# Patient Record
Sex: Female | Born: 1970 | Race: Black or African American | Hispanic: No | Marital: Single | Smoking: Never smoker
Health system: Southern US, Community
[De-identification: ages and names within clinical notes are randomized; demographics above are authoritative.]

## PROBLEM LIST (undated history)

## (undated) DIAGNOSIS — E079 Disorder of thyroid, unspecified: Secondary | ICD-10-CM

---

## 2006-06-23 ENCOUNTER — Emergency Department (HOSPITAL_COMMUNITY): Admission: EM | Admit: 2006-06-23 | Discharge: 2006-06-23 | Payer: Self-pay | Admitting: Emergency Medicine

## 2007-05-25 ENCOUNTER — Encounter (HOSPITAL_COMMUNITY): Admission: RE | Admit: 2007-05-25 | Discharge: 2007-05-26 | Payer: Self-pay | Admitting: Internal Medicine

## 2010-08-27 ENCOUNTER — Encounter: Payer: Self-pay | Admitting: Family Medicine

## 2012-07-21 ENCOUNTER — Encounter: Payer: Self-pay | Admitting: Physician Assistant

## 2012-07-21 ENCOUNTER — Ambulatory Visit (INDEPENDENT_AMBULATORY_CARE_PROVIDER_SITE_OTHER): Payer: Medicaid Other | Admitting: Physician Assistant

## 2012-07-21 VITALS — BP 110/70 | HR 86 | Temp 98.7°F | Resp 18 | Ht 68.0 in | Wt 219.0 lb

## 2012-07-21 DIAGNOSIS — N912 Amenorrhea, unspecified: Secondary | ICD-10-CM

## 2012-07-21 NOTE — Progress Notes (Signed)
   Patient ID: Cheyenne Campbell MRN: [id number], DOB: 10/30/1970, 42 y.o. Date of Encounter: 07/21/2012, 12:13 PM    Chief Complaint:  Chief Complaint  Patient presents with  . other    possible preg     HPI: 42 y.o. year old female reports that her menses have always been very regular. However, never had period in April. LMP was 06/02/12.  Did home pregnancy test-negative. But wanted to check here. Has no other symptoms or complaints.  Mentions that she has been seeing Dr Jeanell Sparrow for hyperthyroidism. Brought copy of recent lab- numbers currently close to nml.      Home Meds: See attached medication section for any medications that were entered at today's visit. The computer does not put those onto this list.The following list is a list of meds entered prior to today's visit.   No current outpatient prescriptions on file prior to visit.   No current facility-administered medications on file prior to visit.    Allergies: No Known Allergies    Review of Systems: See HPI for pertinent ROS. All other ROS negative.    Physical Exam: Blood pressure 110/70, pulse 86, temperature 98.7 F (37.1 C), temperature source Oral, resp. rate 18, height 5\' 8"  (1.727 m), weight 219 lb (99.338 kg), last menstrual period 06/09/2012., Body mass index is 33.31 kg/(m^2). General: AAF.  Appears in no acute distress.  Lungs: Clear bilaterally to auscultation without wheezes, rales, or rhonchi. Breathing is unlabored. Heart: Regular rhythm. No murmurs, rubs, or gallops. Msk:  Strength and tone normal for age. Extremities/Skin: Warm and dry. No clubbing or cyanosis. No edema. No rashes or suspicious lesions. Neuro: Alert and oriented X 3. Moves all extremities spontaneously. Gait is normal. CNII-XII grossly in tact. Psych:  Responds to questions appropriately with a normal affect.   Results for orders placed in visit on 07/21/12  PREGNANCY, URINE      Result Value Range   Preg Test, Ur NEG        ASSESSMENT AND PLAN:  42 y.o. year old female with  1. Amenorrhea - Pregnancy, urine negative  Discussed that she is probably starting to undergo perimenopause. Discussed that often women have 1-2 years of irregular menses then menopause so she can anticipate possible irregular periods. F/U if develops any other symptoms, etc.    Signed, [address] [name], [place], [facility] 07/21/2012 12:13 PM

## 2012-07-22 ENCOUNTER — Encounter (HOSPITAL_COMMUNITY): Payer: Self-pay | Admitting: Emergency Medicine

## 2012-07-22 ENCOUNTER — Emergency Department (HOSPITAL_COMMUNITY)
Admission: EM | Admit: 2012-07-22 | Discharge: 2012-07-22 | Disposition: A | Discharge status: Home / Self Care | Payer: Medicaid Other | Attending: Emergency Medicine | Admitting: Emergency Medicine

## 2012-07-22 DIAGNOSIS — Z79899 Other long term (current) drug therapy: Secondary | ICD-10-CM | POA: Insufficient documentation

## 2012-07-22 DIAGNOSIS — Z862 Personal history of diseases of the blood and blood-forming organs and certain disorders involving the immune mechanism: Secondary | ICD-10-CM | POA: Insufficient documentation

## 2012-07-22 DIAGNOSIS — R21 Rash and other nonspecific skin eruption: Secondary | ICD-10-CM

## 2012-07-22 DIAGNOSIS — Z8639 Personal history of other endocrine, nutritional and metabolic disease: Secondary | ICD-10-CM | POA: Insufficient documentation

## 2012-07-22 HISTORY — DX: Disorder of thyroid, unspecified: E07.9

## 2012-07-22 MED ORDER — HYDROCORTISONE 2.5 % EX LOTN: TOPICAL_LOTION | Freq: Two times a day (BID) | CUTANEOUS | Status: DC

## 2012-07-22 MED ORDER — SULFAMETHOXAZOLE-TRIMETHOPRIM 800-160 MG PO TABS: 1.0000 | ORAL_TABLET | Freq: Two times a day (BID) | ORAL | Status: DC

## 2012-07-22 NOTE — ED Notes (Signed)
Patient reports that last night she started to have swelling to her left arm. The patient has several areas of redness with a bug bite to them. She reports that her arm itches

## 2012-07-22 NOTE — ED Provider Notes (Signed)
History    This chart was scribed for Cheyenne Campbell, a non-physician practitioner working with Cheyenne Racer, MD by Cheyenne Campbell, ED Scribe. This patient was seen in room WTR8/WTR8 and the patient's care was started at 1515.     CSN: [id number]  Arrival date & time 07/22/12  1403   First MD Initiated Contact with Patient 07/22/12 1506      Chief Complaint  Patient presents with  . Rash    (Consider location/radiation/quality/duration/timing/severity/associated sxs/prior treatment) The history is provided by the patient.   HPI Comments: Cheyenne Campbell is a 42 y.o. female who presents to the Emergency Department complaining of constant worsening pruritic rash on left forearm onset last night. Reports intermittent clear drainage, and swelling. Reports possible unknown bug bite last night. Denies fever, chest pain, shortness of breath, and other associated symptoms. Denies any aggravating and alleviating factors. Denies changing laundry detergent, soaps, and lotions. Denies taking any OTC medications to treat symptoms.   Past Medical History  Diagnosis Date  . Thyroid disease     History reviewed. No pertinent past surgical history.  History reviewed. No pertinent family history.  History  Substance Use Topics  . Smoking status: Never Smoker   . Smokeless tobacco: Not on file  . Alcohol Use: No    OB History   Grav Para Term Preterm Abortions TAB SAB Ect Mult Living                  Review of Systems A complete 10 system review of systems was obtained and all systems are negative except as noted in the HPI and PMH.    Allergies  Review of patient's allergies indicates no known allergies.  Home Medications   Current Outpatient Rx  Name  Route  Sig  Dispense  Refill  . folic acid (FOLVITE) 400 MCG tablet   Oral   Take 400 mcg by mouth daily.         . methimazole (TAPAZOLE) 5 MG tablet   Oral   Take 5 mg by mouth 3 (three) times daily.          . Prenatal Vit-Fe Fumarate-FA (PRENATAL MULTIVITAMIN) TABS   Oral   Take 1 tablet by mouth daily at 12 noon.           BP 124/81  Pulse 88  Temp(Src) 98.7 F (37.1 C) (Oral)  Resp 18  Wt 219 lb (99.338 kg)  BMI 33.31 kg/m2  SpO2 100%  LMP 06/09/2012  Physical Exam  Nursing note and vitals reviewed. Constitutional: She is oriented to person, place, and time. She appears well-developed and well-nourished. No distress.  HENT:  Head: Normocephalic and atraumatic.  Mouth/Throat: Oropharynx is clear and moist.  Airway is patent and no swelling of oropharynx, tongue, or lips.   Eyes: EOM are normal.  Neck: Normal range of motion. Neck supple. No tracheal deviation present.  Cardiovascular: Normal rate, regular rhythm, normal heart sounds and intact distal pulses.   Pulmonary/Chest: Effort normal and breath sounds normal. No respiratory distress.  Musculoskeletal: Normal range of motion.  Neurological: She is alert and oriented to person, place, and time.  Skin: Skin is warm and dry.  Erythematous papules on left forearm, no streaking noted. Mild edema surrounding the papules. No warmth noted.   No drainage.  Psychiatric: She has a normal mood and affect. Her behavior is normal.    ED Course  Procedures (including critical care time) Medications - No data to display  Labs Reviewed - No data to display No results found.   No diagnosis found.    MDM  Patient presents with a chief complaint of rash to the left forearm.  Rash appears to be some sort of allergic reaction or localized inflammatory reaction from possible bug bite.  No erythematous streaking.  Patient is afebrile.  Patient appears stable for discharge.  I personally performed the services described in this documentation, which was scribed in my presence. The recorded information has been reviewed and is accurate.    Cheyenne Lux O'Neill, PA-C 07/22/12 [phone]

## 2012-07-22 NOTE — ED Provider Notes (Signed)
Medical screening examination/treatment/procedure(s) were performed by non-physician practitioner and as supervising physician I was immediately available for consultation/collaboration.   Sharrell Krawiec, MD 07/22/12 1606 

## 2012-11-15 ENCOUNTER — Ambulatory Visit: Payer: Medicaid Other

## 2012-12-29 ENCOUNTER — Other Ambulatory Visit: Payer: Self-pay

## 2012-12-29 DIAGNOSIS — Z1231 Encounter for screening mammogram for malignant neoplasm of breast: Secondary | ICD-10-CM

## 2013-01-22 ENCOUNTER — Ambulatory Visit
Admission: RE | Admit: 2013-01-22 | Discharge: 2013-01-22 | Disposition: A | Discharge status: Home / Self Care | Payer: Medicaid Other | Source: Ambulatory Visit

## 2013-01-22 DIAGNOSIS — Z1231 Encounter for screening mammogram for malignant neoplasm of breast: Secondary | ICD-10-CM

## 2013-08-23 ENCOUNTER — Other Ambulatory Visit: Payer: Medicaid Other | Admitting: Physician Assistant

## 2014-07-31 ENCOUNTER — Ambulatory Visit (INDEPENDENT_AMBULATORY_CARE_PROVIDER_SITE_OTHER): Payer: Medicaid Other | Admitting: Family Medicine

## 2014-07-31 ENCOUNTER — Encounter: Payer: Self-pay | Admitting: Family Medicine

## 2014-07-31 VITALS — BP 130/76 | HR 78 | Temp 98.2°F | Resp 16 | Ht 68.0 in | Wt 231.0 lb

## 2014-07-31 DIAGNOSIS — Z113 Encounter for screening for infections with a predominantly sexual mode of transmission: Secondary | ICD-10-CM | POA: Diagnosis not present

## 2014-07-31 DIAGNOSIS — A5901 Trichomonal vulvovaginitis: Secondary | ICD-10-CM | POA: Diagnosis not present

## 2014-07-31 DIAGNOSIS — R300 Dysuria: Secondary | ICD-10-CM

## 2014-07-31 LAB — WET PREP FOR TRICH, YEAST, CLUE: Yeast Wet Prep HPF POC: NONE SEEN

## 2014-07-31 LAB — URINALYSIS, ROUTINE W REFLEX MICROSCOPIC
BILIRUBIN URINE: NEGATIVE
GLUCOSE, UA: NEGATIVE mg/dL
HGB URINE DIPSTICK: NEGATIVE
Ketones, ur: NEGATIVE mg/dL
Nitrite: NEGATIVE
PROTEIN: NEGATIVE mg/dL
Specific Gravity, Urine: 1.015 (ref 1.005–1.030)
UROBILINOGEN UA: 1 mg/dL (ref 0.0–1.0)
pH: 7.5 (ref 5.0–8.0)

## 2014-07-31 LAB — URINALYSIS, MICROSCOPIC ONLY
CASTS: NONE SEEN
Crystals: NONE SEEN

## 2014-07-31 MED ORDER — METRONIDAZOLE 500 MG PO TABS: 500.0000 mg | ORAL_TABLET | Freq: Two times a day (BID) | ORAL | Status: DC

## 2014-07-31 NOTE — Progress Notes (Signed)
Patient ID: Cheyenne Campbell, female   DOB: 1970/07/15, 44 y.o.   MRN: [id number]   Subjective:    Patient ID: Cheyenne Campbell, female    DOB: 1970/07/15, 44 y.o.   MRN: [id number]  Patient presents for STD Screen  Here for STD screening. She has been exposed to Trichomonas. She states [place] advised her that he was positive for Trichomonas and she needed treatment. She states she was last sexually active in either March or April. She's had vaginal discharge for the past 3 weeks which is yellow and irritating. She denies any significant dysuria that is now resolved. She has not had any abnormal bleeding. She denies any abdominal pain. He also states that she had some irritating bumps in her creases of her groin about a week ago she use Vaseline on them and they resolved.   Review Of Systems: per above   GEN- denies fatigue, fever, weight loss,weakness, recent illness HEENT- denies eye drainage, change in vision, nasal discharge, CVS- denies chest pain, palpitations RESP- denies SOB, cough, wheeze ABD- denies N/V, change in stools, abd pain GU- denies dysuria, hematuria, dribbling, incontinence Neuro- denies headache, dizziness, syncope, seizure activity       Objective:    BP 130/76 mmHg  Pulse 78  Temp(Src) 98.2 F (36.8 C) (Oral)  Resp 16  Ht 5\' 8"  (1.727 m)  Wt 231 lb (104.781 kg)  BMI 35.13 kg/m2  LMP 07/23/2014 (Approximate) GEN- NAD, alert and oriented x3 HEENT- PERRL, EOMI, non injected sclera, pink conjunctiva, MMM, oropharynx clear CVS- RRR, no murmur RESP-CTAB ABD-NABS,soft,NT,ND GU- normal external genitalia, vaginal irritated- moist, cervix visualized no growth, no blood form os, + discharge, no CMT, no ovarian masses, uterus normal size Pulses- Radial, 2+        Assessment & Plan:      Problem List Items Addressed This Visit    None    Visit Diagnoses    Dysuria    -  Primary    Relevant Orders    Urinalysis,  Routine w reflex microscopic (Completed)    Trichomonal vaginitis        positive for trichomonas, other STD screen pending, treat with flagyl, discussed safe sex, avoid sex untils symptoms resolved, no lesions to suggest HSV    Relevant Medications    metroNIDAZOLE (FLAGYL) 500 MG tablet    Other Relevant Orders    WET PREP FOR TRICH, YEAST, CLUE (Completed)    GC/Chlamydia Probe Amp    HSV(herpes simplex vrs) 1+2 ab-IgG    Screen for STD (sexually transmitted disease)        Relevant Orders    HIV antibody    RPR       Note: This dictation was prepared with Dragon dictation along with smaller phrase technology. Any transcriptional errors that result from this process are unintentional.

## 2014-07-31 NOTE — Patient Instructions (Signed)
We will call with lab results  F/U as needed 

## 2014-08-01 LAB — GC/CHLAMYDIA PROBE AMP
CT PROBE, AMP APTIMA: NEGATIVE
GC PROBE AMP APTIMA: NEGATIVE

## 2014-08-01 LAB — HSV(HERPES SIMPLEX VRS) I + II AB-IGG
HSV 1 GLYCOPROTEIN G AB, IGG: 6.55 IV — AB
HSV 2 GLYCOPROTEIN G AB, IGG: 14.59 IV — AB

## 2014-08-01 LAB — RPR

## 2014-08-01 LAB — HIV ANTIBODY (ROUTINE TESTING W REFLEX): HIV: NONREACTIVE

## 2014-08-05 ENCOUNTER — Other Ambulatory Visit: Payer: Self-pay | Admitting: *Deleted

## 2014-08-05 MED ORDER — VALACYCLOVIR HCL 500 MG PO TABS: 500.0000 mg | ORAL_TABLET | Freq: Two times a day (BID) | ORAL | Status: DC

## 2016-07-07 ENCOUNTER — Other Ambulatory Visit: Payer: Self-pay | Admitting: Family Medicine

## 2016-07-07 DIAGNOSIS — Z1231 Encounter for screening mammogram for malignant neoplasm of breast: Secondary | ICD-10-CM

## 2016-08-03 ENCOUNTER — Ambulatory Visit
Admission: RE | Admit: 2016-08-03 | Discharge: 2016-08-03 | Disposition: A | Discharge status: Home / Self Care | Payer: Medicaid Other | Source: Ambulatory Visit | Attending: Family Medicine | Admitting: Family Medicine

## 2016-08-03 DIAGNOSIS — Z1231 Encounter for screening mammogram for malignant neoplasm of breast: Secondary | ICD-10-CM

## 2017-03-12 ENCOUNTER — Emergency Department (HOSPITAL_COMMUNITY): Payer: Medicaid Other

## 2017-03-12 ENCOUNTER — Encounter (HOSPITAL_COMMUNITY): Payer: Self-pay

## 2017-03-12 ENCOUNTER — Other Ambulatory Visit: Payer: Self-pay

## 2017-03-12 ENCOUNTER — Emergency Department (HOSPITAL_COMMUNITY)
Admission: EM | Admit: 2017-03-12 | Discharge: 2017-03-12 | Disposition: A | Discharge status: Home / Self Care | Payer: Medicaid Other | Attending: Emergency Medicine | Admitting: Emergency Medicine

## 2017-03-12 DIAGNOSIS — R220 Localized swelling, mass and lump, head: Secondary | ICD-10-CM | POA: Diagnosis present

## 2017-03-12 DIAGNOSIS — K047 Periapical abscess without sinus: Secondary | ICD-10-CM | POA: Diagnosis not present

## 2017-03-12 DIAGNOSIS — Z79899 Other long term (current) drug therapy: Secondary | ICD-10-CM | POA: Diagnosis not present

## 2017-03-12 LAB — CBC WITH DIFFERENTIAL/PLATELET
BASOS ABS: 0 10*3/uL (ref 0.0–0.1)
Basophils Relative: 0 %
Eosinophils Absolute: 0 10*3/uL (ref 0.0–0.7)
Eosinophils Relative: 0 %
HEMATOCRIT: 37.6 % (ref 36.0–46.0)
Hemoglobin: 12.1 g/dL (ref 12.0–15.0)
LYMPHS ABS: 1.7 10*3/uL (ref 0.7–4.0)
LYMPHS PCT: 22 %
MCH: 26.2 pg (ref 26.0–34.0)
MCHC: 32.2 g/dL (ref 30.0–36.0)
MCV: 81.6 fL (ref 78.0–100.0)
MONO ABS: 0.6 10*3/uL (ref 0.1–1.0)
Monocytes Relative: 8 %
NEUTROS ABS: 5.1 10*3/uL (ref 1.7–7.7)
Neutrophils Relative %: 70 %
Platelets: 332 10*3/uL (ref 150–400)
RBC: 4.61 MIL/uL (ref 3.87–5.11)
RDW: 15.4 % (ref 11.5–15.5)
WBC: 7.4 10*3/uL (ref 4.0–10.5)

## 2017-03-12 LAB — BASIC METABOLIC PANEL
ANION GAP: 7 (ref 5–15)
BUN: 7 mg/dL (ref 6–20)
CO2: 29 mmol/L (ref 22–32)
Calcium: 9.1 mg/dL (ref 8.9–10.3)
Chloride: 105 mmol/L (ref 101–111)
Creatinine, Ser: 0.82 mg/dL (ref 0.44–1.00)
GFR calc Af Amer: >60 mL/min (ref >60)
GFR calc non Af Amer: >60 mL/min (ref >60)
GLUCOSE: 98 mg/dL (ref 65–99)
POTASSIUM: 3.6 mmol/L (ref 3.5–5.1)
Sodium: 141 mmol/L (ref 135–145)

## 2017-03-12 MED ORDER — CLINDAMYCIN PHOSPHATE 600 MG/50ML IV SOLN
600.0000 mg | Freq: Once | INTRAVENOUS | Status: AC
  Administered 2017-03-12: 600 mg via INTRAVENOUS
  Filled 2017-03-12: qty 50

## 2017-03-12 MED ORDER — IOPAMIDOL (ISOVUE-300) INJECTION 61%
INTRAVENOUS | Status: AC
  Administered 2017-03-12: 75 mL via INTRAVENOUS
  Filled 2017-03-12: qty 75

## 2017-03-12 MED ORDER — AMLODIPINE BESYLATE 5 MG PO TABS
5.0000 mg | ORAL_TABLET | Freq: Once | ORAL | Status: AC
  Administered 2017-03-12: 5 mg via ORAL
  Filled 2017-03-12: qty 1

## 2017-03-12 MED ORDER — SODIUM CHLORIDE 0.9 % IJ SOLN
INTRAMUSCULAR | Status: AC
  Filled 2017-03-12: qty 50

## 2017-03-12 MED ORDER — AMLODIPINE BESYLATE 5 MG PO TABS: 5.0000 mg | ORAL_TABLET | Freq: Every day | ORAL | 0 refills | Status: DC

## 2017-03-12 MED ORDER — CLINDAMYCIN HCL 150 MG PO CAPS: 150.0000 mg | ORAL_CAPSULE | Freq: Four times a day (QID) | ORAL | 0 refills | Status: DC

## 2017-03-12 MED ORDER — IBUPROFEN 200 MG PO TABS
400.0000 mg | ORAL_TABLET | Freq: Once | ORAL | Status: AC
  Administered 2017-03-12: 400 mg via ORAL
  Filled 2017-03-12: qty 2

## 2017-03-12 MED ORDER — TRAMADOL HCL 50 MG PO TABS: 50.0000 mg | ORAL_TABLET | Freq: Four times a day (QID) | ORAL | 0 refills | Status: DC | PRN

## 2017-03-12 NOTE — ED Triage Notes (Addendum)
Pt reports facial swelling since yesterday. Swelling noted under L eye beside her nose in what appears to be her L maxillary sinus. She also reports congestion. A&Ox4. Ambulatory.

## 2017-03-12 NOTE — ED Provider Notes (Signed)
[facility]-EMERGENCY DEPT Provider Note   CSN: [id number] Arrival date & time: 03/12/17  1233     History   Chief Complaint Chief Complaint  Patient presents with  . Facial Swelling    HPI Cheyenne Campbell is a 46 y.o. female.  HPI Patient presents to the emergency room for evaluation of facial swelling.  Patient states her symptoms started yesterday.  She has had some nasal congestion but yesterday she started developing swelling adjacent to the left side of her nose and below her eye.  This morning she also noticed what looked like a little bit of bruising beneath her eye.  She denies any injuries or falls.  She denies a toothache.  No fevers or chills.  No difficulty breathing.  No vomiting or diarrhea Past Medical History:  Diagnosis Date  . Thyroid disease     There are no active problems to display for this patient.   History reviewed. No pertinent surgical history.  OB History    No data available       Home Medications    Prior to Admission medications   Medication Sig Start Date End Date Taking? Authorizing Provider  aspirin 325 MG EC tablet Take 650 mg by mouth every 6 (six) hours as needed for pain.   Yes [provider]  Cholecalciferol (VITAMIN D-400 PO) Take 2 tablets by mouth daily.   Yes [provider]  methimazole (TAPAZOLE) 10 MG tablet Take 20 mg by mouth daily.    Yes [provider]  amLODipine (NORVASC) 5 MG tablet Take 1 tablet (5 mg total) by mouth daily. 03/12/17   Linwood Dibbles, MD  clindamycin (CLEOCIN) 150 MG capsule Take 1 capsule (150 mg total) by mouth every 6 (six) hours. 03/12/17   Linwood Dibbles, MD  metroNIDAZOLE (FLAGYL) 500 MG tablet Take 1 tablet (500 mg total) by mouth 2 (two) times daily. Patient not taking: Reported on 03/12/2017 07/31/14   Salley Scarlet, MD  traMADol (ULTRAM) 50 MG tablet Take 1 tablet (50 mg total) by mouth every 6 (six) hours as needed. 03/12/17   Linwood Dibbles, MD    valACYclovir (VALTREX) 500 MG tablet Take 1 tablet (500 mg total) by mouth 2 (two) times daily. Patient not taking: Reported on 03/12/2017 08/05/14   Salley Scarlet, MD    Family History History reviewed. No pertinent family history.  Social History Social History   Tobacco Use  . Smoking status: Never Smoker  Substance Use Topics  . Alcohol use: No  . Drug use: No     Allergies   Patient has no known allergies.   Review of Systems Review of Systems  All other systems reviewed and are negative.    Physical Exam Updated Vital Signs BP (!) 175/109 (BP Location: Right Arm)   Pulse 98   Temp 98.6 F (37 C) (Oral)   Resp 14   SpO2 97%   Physical Exam  Constitutional: She appears well-developed and well-nourished. No distress.  HENT:  Head: Normocephalic and atraumatic.  Right Ear: External ear normal.  Left Ear: External ear normal.  Nose: Sinus tenderness present. No rhinorrhea or septal deviation. No epistaxis.  Mouth/Throat: No trismus in the jaw. Dental caries present. No uvula swelling or lacerations.  Several absent teeth, no dental tenderness; no induration and edema inferior to the left eye and adjacent to the left nose, tenderness to palpation left side of the face  Eyes: Conjunctivae are normal. Right eye exhibits no  discharge. Left eye exhibits no discharge. No scleral icterus.  Neck: Neck supple. No tracheal deviation present.  Cardiovascular: Normal rate, regular rhythm and intact distal pulses.  Pulmonary/Chest: Effort normal and breath sounds normal. No stridor. No respiratory distress. She has no wheezes. She has no rales.  Abdominal: Soft. Bowel sounds are normal. She exhibits no distension. There is no tenderness. There is no rebound and no guarding.  Musculoskeletal: She exhibits no edema or tenderness.  Neurological: She is alert. She has normal strength. No cranial nerve deficit (no facial droop, extraocular movements intact, no slurred speech)  or sensory deficit. She exhibits normal muscle tone. She displays no seizure activity. Coordination normal.  Skin: Skin is warm and dry. No rash noted.  Psychiatric: She has a normal mood and affect.  Nursing note and vitals reviewed.    ED Treatments / Results  Labs (all labs ordered are listed, but only abnormal results are displayed) Labs Reviewed  CBC WITH DIFFERENTIAL/PLATELET  BASIC METABOLIC PANEL    EKG  EKG Interpretation None       Radiology Ct Maxillofacial W Contrast  Result Date: 03/12/2017 CLINICAL DATA:  Left facial swelling since yesterday EXAM: CT MAXILLOFACIAL WITH CONTRAST TECHNIQUE: Multidetector CT imaging of the maxillofacial structures was performed with intravenous contrast. Multiplanar CT image reconstructions were also generated. CONTRAST:  75mL ISOVUE-300 IOPAMIDOL (ISOVUE-300) INJECTION 61% COMPARISON:  None. FINDINGS: Osseous: Negative for fracture Poor dentition. Multiple caries. Multiple periapical lucencies upper lower teeth. Orbits: Negative for orbital abscess or mass. Sinuses: Mild mucosal edema maxillary sinus bilaterally. No air-fluid level Soft tissues: Soft tissue swelling anterior to the maxilla on the left at the base of the nose. 12 mm fluid collection anterior to the maxilla on the left adjacent to periapical lucency around left upper bicuspid. This is consistent with dental abscess extending into the soft tissues and causing cellulitis. Limited intracranial: Negative IMPRESSION: Poor dentition with multiple caries and periapical lucencies due to infection 12 mm soft tissue abscess anterior to the maxilla on the left compatible with dental infection related to left upper bicuspid. Electronically Signed   By: Marlan Palauharles  Clark M.D.   On: 03/12/2017 19:54    Procedures Procedures (including critical care time)  Medications Ordered in ED Medications  sodium chloride 0.9 % injection (not administered)  clindamycin (CLEOCIN) IVPB 600 mg (not  administered)  amLODipine (NORVASC) tablet 5 mg (not administered)  ibuprofen (ADVIL,MOTRIN) tablet 400 mg (not administered)  iopamidol (ISOVUE-300) 61 % injection (75 mLs Intravenous Contrast Given 03/12/17 1926)     Initial Impression / Assessment and Plan / ED Course  I have reviewed the triage vital signs and the nursing notes.  Pertinent labs & imaging results that were available during my care of the patient were reviewed by me and considered in my medical decision making (see chart for details).   Patient appears to have a facial cellulitis associated with a dental abscess.  Patient has multiple dental caries.  I do not see any clear fluctuant area intraorally to perform an I&D.  Patient states she has had this trouble in the past.  Typically antibiotics have helped.  She is planning to see a dentist this week.  I have given her a dose of IV antibiotics.  Plan on discharge home with oral antibiotics.  Warning signs and precautions discussed.  Patient is also having hypertension.  She states normally her blood pressure is okay.  I will start her on blood pressure medications and have her follow-up with  her primary care doctor.  Final Clinical Impressions(s) / ED Diagnoses   Final diagnoses:  Dental abscess    ED Discharge Orders        Ordered    clindamycin (CLEOCIN) 150 MG capsule  Every 6 hours     03/12/17 2020    amLODipine (NORVASC) 5 MG tablet  Daily     03/12/17 2020    traMADol (ULTRAM) 50 MG tablet  Every 6 hours PRN     03/12/17 2020       Linwood DibblesKnapp, Goddess Gebbia, MD 03/12/17 2030

## 2017-03-12 NOTE — Discharge Instructions (Signed)
Follow-up with your primary care doctor regarding her blood pressure.  Make sure to see your dentist as soon as possible regarding your dental abscess

## 2017-06-07 ENCOUNTER — Ambulatory Visit: Payer: Medicaid Other

## 2017-06-07 VITALS — BP 150/108

## 2017-06-07 DIAGNOSIS — Z0130 Encounter for examination of blood pressure without abnormal findings: Secondary | ICD-10-CM

## 2017-06-07 NOTE — Progress Notes (Signed)
Patient came in today stating that her BP was elevated when she went to the dentist for an abscess tooth. Checked patient BP after she sat for about 10 minutes. Patient's BP was 150/108. Advised patient to schedule appointment with Dorena BodoMary B Dixon, PA tomorrow.Patient is scheduled to see Dorena BodoMary B Dixon, PA at 12:00 pm. Advised patient if blood pressure increases or severe headache, chest pain, or SOB occurs to go to the ER immediately. Patient verbalized understanding.

## 2017-06-08 ENCOUNTER — Ambulatory Visit (INDEPENDENT_AMBULATORY_CARE_PROVIDER_SITE_OTHER): Payer: Medicaid Other | Admitting: Physician Assistant

## 2017-06-08 ENCOUNTER — Other Ambulatory Visit: Payer: Self-pay

## 2017-06-08 ENCOUNTER — Encounter: Payer: Self-pay | Admitting: Physician Assistant

## 2017-06-08 DIAGNOSIS — E0590 Thyrotoxicosis, unspecified without thyrotoxic crisis or storm: Secondary | ICD-10-CM | POA: Diagnosis not present

## 2017-06-08 DIAGNOSIS — I10 Essential (primary) hypertension: Secondary | ICD-10-CM | POA: Diagnosis not present

## 2017-06-08 MED ORDER — AMLODIPINE BESYLATE 5 MG PO TABS: 5.0000 mg | ORAL_TABLET | Freq: Every day | ORAL | 0 refills | Status: DC

## 2017-06-08 NOTE — Progress Notes (Signed)
Patient ID: Ryan Palermo MRN: [id number], DOB: July 09, 1970, 47 y.o. Date of Encounter: 06/08/2017, 12:55 PM    Chief Complaint:  Chief Complaint  Patient presents with  . Hypertension     HPI: 47 y.o. year old female presents with above.   I reviewed chart.  There is a staff note from yesterday that states: "Patient came in today stating that her BP was elevated when she went to the dentist for an abscess tooth. Checked patient BP after she sat for about 10 minutes. Patient's BP was 150/108. Advised patient to schedule appointment with Dorena Bodo, PA tomorrow.Patient is scheduled to see Dorena Bodo, PA at 12:00 pm. Advised patient if blood pressure increases or severe headache, chest pain, or SOB occurs to go to the ER immediately. Patient verbalized understanding. "  Also today when I reviewed her medication list I can see that 03/12/17 Norvasc 5 mg daily was prescribed by a ER Physician. Today patient reports that that was prescribed from the ER but that it had a sticker on it stating that it could cause dizziness so she was scared to take it any time that she was going to need to be driving her kids to school.  She ended up only taking it for a couple of days and then did not take it because of that concern and then forgot about it and just has not been taking it.  Also she does have questions about diet and exercise recommendations to help control her blood pressure and also to help lose some weight.  She reports that in addition to working at [employer] she also is working at [employer] in the evenings from 6-10. Says that she is on her feet and does a lot of walking and movement with her job.  Also has been trying to drink lots of water.   She also reports that in the past we had referred her to Dr. Jeanell Sparrow for her thyroid but she recently received notification that he is retiring so she needs to see a new doctor to manage her thyroid.  No other concerns to address  today.      Home Meds:   Outpatient Medications Prior to Visit  Medication Sig Dispense Refill  . aspirin 325 MG EC tablet Take 650 mg by mouth every 6 (six) hours as needed for pain.    . Cholecalciferol (VITAMIN D-400 PO) Take 2 tablets by mouth daily.    . methimazole (TAPAZOLE) 10 MG tablet Take 20 mg by mouth daily.     . valACYclovir (VALTREX) 500 MG tablet Take 1 tablet (500 mg total) by mouth 2 (two) times daily. (Patient not taking: Reported on 03/12/2017) 6 tablet 0  . amLODipine (NORVASC) 5 MG tablet Take 1 tablet (5 mg total) by mouth daily. (Patient not taking: Reported on 06/08/2017) 30 tablet 0  . clindamycin (CLEOCIN) 150 MG capsule Take 1 capsule (150 mg total) by mouth every 6 (six) hours. 28 capsule 0  . metroNIDAZOLE (FLAGYL) 500 MG tablet Take 1 tablet (500 mg total) by mouth 2 (two) times daily. (Patient not taking: Reported on 03/12/2017) 14 tablet 0  . traMADol (ULTRAM) 50 MG tablet Take 1 tablet (50 mg total) by mouth every 6 (six) hours as needed. 15 tablet 0   No facility-administered medications prior to visit.     Allergies: No Known Allergies    Review of Systems: See HPI for pertinent ROS. All other ROS negative.  Physical Exam: Blood pressure (!) 142/90, pulse 96, temperature 98 F (36.7 C), temperature source Oral, resp. rate 16, height 5\' 8"  (1.727 m), weight 108.4 kg (239 lb), SpO2 100 %., Body mass index is 36.34 kg/m. General:  Obese AAF. Appears in no acute distress. Neck: Supple. No thyromegaly. No lymphadenopathy.  No carotid bruit. Lungs: Clear bilaterally to auscultation without wheezes, rales, or rhonchi. Breathing is unlabored. Heart: Regular rhythm. No murmurs, rubs, or gallops. Msk:  Strength and tone normal for age. Extremities/Skin: Warm and dry. No LE edema.  Neuro: Alert and oriented X 3. Moves all extremities spontaneously. Gait is normal. CNII-XII grossly in tact. Psych:  Responds to questions appropriately with a normal  affect.     ASSESSMENT AND PLAN:  47 y.o. year old female with  1. Essential hypertension Reassured her that this will not cause significant dizziness.  Recommend that she take it each morning.  She is agreeable to resume this medication and return to recheck BP in 2 weeks. Also discussed that cardiovascular exercise is the type of exercise that will help reduce blood pressure and help with weight management.  Recommend walking as much as possible even at her job.  Also discussed low-sodium diet and foods to avoid.  Specifically discussed some foods that seem like they would be healthy but do have high sodium including soups and canned foods frozen dinner such as lean cuisine.  Also any type of processed meats and pork such as bacon and barbecue. - amLODipine (NORVASC) 5 MG tablet; Take 1 tablet (5 mg total) by mouth daily.  Dispense: 30 tablet; Refill: 0  2. Hyperthyroidism I  will put in referral for her to establish with a new endocrinologist. - Ambulatory referral to Endocrinology   Signed, Shon HaleMary Beth HowardDixon, [place], [facility] 06/08/2017 12:55 PM

## 2017-06-22 ENCOUNTER — Ambulatory Visit: Payer: Medicaid Other | Admitting: Physician Assistant

## 2017-06-22 ENCOUNTER — Encounter: Payer: Self-pay | Admitting: Physician Assistant

## 2017-06-22 VITALS — BP 130/88 | HR 100 | Temp 98.2°F | Resp 16 | Ht 68.0 in | Wt 238.6 lb

## 2017-06-22 DIAGNOSIS — I10 Essential (primary) hypertension: Secondary | ICD-10-CM

## 2017-06-22 NOTE — Progress Notes (Signed)
Patient ID: Cheyenne Campbell MRN: [id number], DOB: [birth year], 47 y.o. Date of Encounter: 06/22/2017, 4:23 PM    Chief Complaint:  Chief Complaint  Patient presents with  . 2 Week Follow-up    discuss blood pressure     HPI: 47 y.o. year old female presents with above.     06/08/2017: I reviewed chart.  There is a staff note from yesterday that states: "Patient came in today stating that her BP was elevated when she went to the dentist for an abscess tooth. Checked patient BP after she sat for about 10 minutes. Patient's BP was 150/108. Advised patient to schedule appointment with Cheyenne Campbell tomorrow.Patient is scheduled to see Cheyenne Campbell at 12:00 pm. Advised patient if blood pressure increases or severe headache, chest pain, or SOB occurs to go to the ER immediately. Patient verbalized understanding. "  Also today when I reviewed her medication list I can see that 03/12/17 Norvasc 5 mg daily was prescribed by a ER Physician. Today patient reports that that was prescribed from the ER but that it had a sticker on it stating that it could cause dizziness so she was scared to take it any time that she was going to need to be driving her kids to school.  She ended up only taking it for a couple of days and then did not take it because of that concern and then forgot about it and just has not been taking it.  Also she does have questions about diet and exercise recommendations to help control her blood pressure and also to help lose some weight.  She reports that in addition to working at [employer] she also is working at [employer] in the evenings from 6-10. Says that she is on her feet and does a lot of walking and movement with her job.  Also has been trying to drink lots of water.   She also reports that in the past we had referred her to Cheyenne Campbell for her thyroid but she recently received notification that he is retiring so she needs to see a new doctor to manage her  thyroid.  No other concerns to address today.   A/P AT THAT OV 06/08/2017:  1. Essential hypertension Reassured her that this will not cause significant dizziness.  Recommend that she take it each morning.  She is agreeable to resume this medication and return to recheck BP in 2 weeks. Also discussed that cardiovascular exercise is the type of exercise that will help reduce blood pressure and help with weight management.  Recommend walking as much as possible even at her job.  Also discussed low-sodium diet and foods to avoid.  Specifically discussed some foods that seem like they would be healthy but do have high sodium including soups and canned foods frozen dinner such as lean cuisine.  Also any type of processed meats and pork such as bacon and barbecue. - amLODipine (NORVASC) 5 MG tablet; Take 1 tablet (5 mg total) by mouth daily.  Dispense: 30 tablet; Refill: 0  2. Hyperthyroidism I  will put in referral for her to establish with a new endocrinologist. - Ambulatory referral to Endocrinology      06/22/2017: Today she reports that she has been taking the Norvasc 5 mg daily.  She is having no lightheadedness or other adverse effects.  She has not had her blood pressure checked anywhere since last visit here. Asked about diet and exercise changes and she  says "oh, yes" that she is doing that-- but then the only thing she names off is doing some exercises in the floor in the mornings. States that she does have an appointment to see a new endocrinologist in about 2 weeks.    Home Meds:   Outpatient Medications Prior to Visit  Medication Sig Dispense Refill  . amLODipine (NORVASC) 5 MG tablet Take 1 tablet (5 mg total) by mouth daily. 30 tablet 0  . aspirin 325 MG EC tablet Take 650 mg by mouth every 6 (six) hours as needed for pain.    . Cholecalciferol (VITAMIN D-400 PO) Take 2 tablets by mouth daily.    . methimazole (TAPAZOLE) 10 MG tablet Take 20 mg by mouth daily.     .  valACYclovir (VALTREX) 500 MG tablet Take 1 tablet (500 mg total) by mouth 2 (two) times daily. 6 tablet 0   No facility-administered medications prior to visit.     Allergies: No Known Allergies    Review of Systems: See HPI for pertinent ROS. All other ROS negative.    Physical Exam: Blood pressure 130/88, pulse 100, temperature 98.2 F (36.8 C), temperature source Oral, resp. rate 16, height 5\' 8"  (1.727 m), weight 108.2 kg (238 lb 9.6 oz), SpO2 98 %., Body mass index is 36.28 kg/m. General:  Obese AAF. Appears in no acute distress. Neck: Supple. No thyromegaly. No lymphadenopathy.  No carotid bruit. Lungs: Clear bilaterally to auscultation without wheezes, rales, or rhonchi. Breathing is unlabored. Heart: Regular rhythm. No murmurs, rubs, or gallops. Msk:  Strength and tone normal for age. Extremities/Skin: Warm and dry. No LE edema.  Neuro: Alert and oriented X 3. Moves all extremities spontaneously. Gait is normal. CNII-XII grossly in tact. Psych:  Responds to questions appropriately with a normal affect.     ASSESSMENT AND PLAN:  47 y.o. year old female with   1. Essential hypertension 06/22/2017: Blood Pressure is controlled.  Continue Norvasc 5 mg daily.  Wll have her return in 3 months to monitor.  2. Hyperthyroidism 06/22/2017: She reports that she has heard about the endocrinology appointment and has appointment to see a new endocrinologist for about 2 weeks from now.   Murray Hodgkins Brule, [place], [facility] 06/22/2017 4:23 PM

## 2017-07-05 ENCOUNTER — Encounter: Payer: Self-pay | Admitting: "Endocrinology

## 2017-07-05 ENCOUNTER — Ambulatory Visit: Payer: Medicaid Other | Admitting: "Endocrinology

## 2017-07-05 VITALS — BP 134/86 | HR 90 | Ht 68.0 in | Wt 239.0 lb

## 2017-07-05 DIAGNOSIS — E0590 Thyrotoxicosis, unspecified without thyrotoxic crisis or storm: Secondary | ICD-10-CM | POA: Diagnosis not present

## 2017-07-05 NOTE — Progress Notes (Signed)
Endocrinology Consult Note                                            07/05/2017, 3:26 PM   Subjective:    Patient ID: Cheyenne JarvisYvette Campbell, female    DOB: [birth year], PCP Dorena Bodoixon, Mary B, PA-C   Past Medical History:  Diagnosis Date  . Thyroid disease    History reviewed. No pertinent surgical history. Social History   Socioeconomic History  . Marital status: Single    Spouse name: Not on file  . Number of children: Not on file  . Years of education: Not on file  . Highest education level: Not on file  Occupational History  . Not on file  Social Needs  . Financial resource strain: Not on file  . Food insecurity:    Worry: Not on file    Inability: Not on file  . Transportation needs:    Medical: Not on file    Non-medical: Not on file  Tobacco Use  . Smoking status: Never Smoker  . Smokeless tobacco: Never Used  Substance and Sexual Activity  . Alcohol use: No  . Drug use: No  . Sexual activity: Not on file  Lifestyle  . Physical activity:    Days per week: Not on file    Minutes per session: Not on file  . Stress: Not on file  Relationships  . Social connections:    Talks on phone: Not on file    Gets together: Not on file    Attends religious service: Not on file    Active member of club or organization: Not on file    Attends meetings of clubs or organizations: Not on file    Relationship status: Not on file  Other Topics Concern  . Not on file  Social History Narrative  . Not on file   Outpatient Encounter Medications as of 07/05/2017  Medication Sig  . amLODipine (NORVASC) 5 MG tablet Take 1 tablet (5 mg total) by mouth daily.  . [DISCONTINUED] aspirin 325 MG EC tablet Take 650 mg by mouth every 6 (six) hours as needed for pain.  . [DISCONTINUED] Cholecalciferol (VITAMIN D-400 PO) Take 2 tablets by mouth daily.  . [DISCONTINUED] methimazole (TAPAZOLE) 10 MG tablet Take 20 mg by mouth daily.   . [DISCONTINUED] valACYclovir (VALTREX) 500 MG tablet  Take 1 tablet (500 mg total) by mouth 2 (two) times daily.   No facility-administered encounter medications on file as of 07/05/2017.    ALLERGIES: No Known Allergies  VACCINATION STATUS: Immunization History  Administered Date(s) Administered  . Influenza Split 11/01/2012    HPI Cheyenne Campbell is 47 y.o. female who presents today with a medical history as above. she is being seen in consultation for hyperthyroidism requested by Dorena Bodoixon, Mary B, PA-C.   -She reports that she was diagnosed with hyperthyroidism in 2009 when she was initiated on treatment with methimazole.  Except for one year of interruption, she took this medication for at least 8 years at various doses.  She is currently on methimazole 10 mg p.o. twice daily. -She does not have recent thyroid function test to review.  Her thyroid uptake and scan was from 2009, when it showed uniform uptake of 73% in 24 hours consistent with Graves' disease. -She does not have acute complaints at this time.  She denies  heat/cold intolerance, tremors, palpitations, recent weight changes. -She has history of goiter, did not require intervention. She has unidentified thyroid dysfunction in 1 of her sisters.  Review of Systems  Constitutional: + steady weight, no fatigue, no subjective hyperthermia, no subjective hypothermia Eyes: no blurry vision, no xerophthalmia ENT: no sore throat, no nodules palpated in throat, no dysphagia/odynophagia, no hoarseness Cardiovascular: no Chest Pain, no Shortness of Breath, no palpitations, no leg swelling Respiratory: no cough, no SOB Gastrointestinal: no Nausea/Vomiting/Diarhhea Musculoskeletal: no muscle/joint aches Skin: no rashes Neurological: no tremors, no numbness, no tingling, no dizziness Psychiatric: no depression, no anxiety  Objective:    BP 134/86   Pulse 90   Ht 5\' 8"  (1.727 m)   Wt 239 lb (108.4 kg)   BMI 36.34 kg/m   Wt Readings from Last 3 Encounters:  07/05/17 239 lb (108.4 kg)   06/22/17 238 lb 9.6 oz (108.2 kg)  06/08/17 239 lb (108.4 kg)    Physical Exam  Constitutional: + Obese, not in acute distress, normal state of mind Eyes: PERRLA, EOMI, no exophthalmos ENT: moist mucous membranes, + thyromegaly, no cervical lymphadenopathy Cardiovascular: normal precordial activity, Regular Rate and Rhythm, no Murmur/Rubs/Gallops Respiratory:  adequate breathing efforts, no gross chest deformity, Clear to auscultation bilaterally Gastrointestinal: abdomen soft, Non -tender, No distension, Bowel Sounds present Musculoskeletal: no gross deformities, strength intact in all four extremities Skin: moist, warm, no rashes Neurological: no tremor with outstretched hands, Deep tendon reflexes normal in all four extremities.  CMP ( most recent) CMP     Component Value Date/Time   NA 141 03/12/2017 1811   K 3.6 03/12/2017 1811   CL 105 03/12/2017 1811   CO2 29 03/12/2017 1811   GLUCOSE 98 03/12/2017 1811   BUN 7 03/12/2017 1811   CREATININE 0.82 03/12/2017 1811   CALCIUM 9.1 03/12/2017 1811   GFRNONAA >60 03/12/2017 1811   GFRAA >60 03/12/2017 1811   No recent thyroid function test to review.  Assessment & Plan:   1. 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 - I have reviewed her available thyroid records and clinically evaluated the patient. -Workup is remote, thyroid uptake and scan is from 2009. -Based on review of her records, I suggested for her to discontinue methimazole and repeat full profile thyroid function tests in 1 week. -If her repeat labs are still suggestive of hyperthyroidism, the best course of action for her would be thyroid ablation with I-131 preceded by repeat thyroid uptake and scan. -She is asked to return in 10 days to review her new thyroid function test. -We will request for more records from her previous endocrinology provider Dr. Chestine Spore.  - I did not initiate any new  prescriptions today.   - I advised patient to maintain close follow up with Dorena Bodo, PA-C for primary care needs.   - Time spent with the patient: 45 minutes, of which >50% was spent in obtaining information about her symptoms, reviewing her previous labs, evaluations, and treatments, counseling her about her hyperthyroidism from Graves' disease, and developing a plan to confirm the diagnosis and long term treatment as necessary.  Carlo Gervacio participated in the discussions, expressed understanding, and voiced agreement with the above plans.  All questions were answered to her satisfaction. she is encouraged to contact clinic should she have any questions or concerns prior to her return visit.  Follow up plan: Return in about  10 days (around 07/15/2017), or labs after May 1.   Marquis Lunch, MD [facility] [address] [place], [place] [address] Phone: [phone]  Fax: [phone]     07/05/2017, 3:26 PM  This note was partially dictated with voice recognition software. Similar sounding words can be transcribed inadequately or may not  be corrected upon review.

## 2017-07-09 ENCOUNTER — Other Ambulatory Visit: Payer: Self-pay | Admitting: Physician Assistant

## 2017-07-09 DIAGNOSIS — I10 Essential (primary) hypertension: Secondary | ICD-10-CM

## 2017-07-12 LAB — TSH: TSH: 0.72 mIU/L

## 2017-07-12 LAB — T4, FREE: Free T4: 1.2 ng/dL (ref 0.8–1.8)

## 2017-07-12 LAB — T3, FREE: T3 FREE: 2.8 pg/mL (ref 2.3–4.2)

## 2017-07-13 LAB — THYROID PEROXIDASE ANTIBODY: Thyroperoxidase Ab SerPl-aCnc: >900 IU/mL — ABNORMAL HIGH (ref <9)

## 2017-07-13 LAB — THYROGLOBULIN ANTIBODY: Thyroglobulin Ab: 2 IU/mL — ABNORMAL HIGH (ref <=1)

## 2017-08-02 ENCOUNTER — Ambulatory Visit: Payer: Medicaid Other | Admitting: "Endocrinology

## 2017-08-03 ENCOUNTER — Encounter: Payer: Self-pay | Admitting: "Endocrinology

## 2017-08-03 ENCOUNTER — Ambulatory Visit (INDEPENDENT_AMBULATORY_CARE_PROVIDER_SITE_OTHER): Payer: Medicaid Other | Admitting: "Endocrinology

## 2017-08-03 VITALS — BP 132/85 | HR 91 | Ht 68.0 in | Wt 240.0 lb

## 2017-08-03 DIAGNOSIS — E0500 Thyrotoxicosis with diffuse goiter without thyrotoxic crisis or storm: Secondary | ICD-10-CM

## 2017-08-03 DIAGNOSIS — E0590 Thyrotoxicosis, unspecified without thyrotoxic crisis or storm: Secondary | ICD-10-CM | POA: Diagnosis not present

## 2017-08-03 NOTE — Progress Notes (Signed)
Endocrinology follow-up note                                            08/03/2017, 1:55 PM   Subjective:    Patient ID: Cheyenne Campbell, female    DOB: [birth year], PCP Dorena Bodo, PA-C   Past Medical History:  Diagnosis Date  . Thyroid disease    History reviewed. No pertinent surgical history. Social History   Socioeconomic History  . Marital status: Single    Spouse name: Not on file  . Number of children: Not on file  . Years of education: Not on file  . Highest education level: Not on file  Occupational History  . Not on file  Social Needs  . Financial resource strain: Not on file  . Food insecurity:    Worry: Not on file    Inability: Not on file  . Transportation needs:    Medical: Not on file    Non-medical: Not on file  Tobacco Use  . Smoking status: Never Smoker  . Smokeless tobacco: Never Used  Substance and Sexual Activity  . Alcohol use: No  . Drug use: No  . Sexual activity: Not on file  Lifestyle  . Physical activity:    Days per week: Not on file    Minutes per session: Not on file  . Stress: Not on file  Relationships  . Social connections:    Talks on phone: Not on file    Gets together: Not on file    Attends religious service: Not on file    Active member of club or organization: Not on file    Attends meetings of clubs or organizations: Not on file    Relationship status: Not on file  Other Topics Concern  . Not on file  Social History Narrative  . Not on file   Outpatient Encounter Medications as of 08/03/2017  Medication Sig  . amLODipine (NORVASC) 5 MG tablet TAKE 1 TABLET BY MOUTH EVERY DAY   No facility-administered encounter medications on file as of 08/03/2017.    ALLERGIES: No Known Allergies  VACCINATION STATUS: Immunization History  Administered Date(s) Administered  . Influenza Split 11/01/2012    HPI Cheyenne Campbell is 47 y.o. female who returns with repeat thyroid function tests after she was seen in  consultation for history of hyperthyroidism from Graves' disease.   -She reports that she was diagnosed with hyperthyroidism in 2009 when she was initiated on treatment with methimazole.  Except for one year of interruption, she took this medication for at least 8 years at various doses.  She is currently on methimazole 10 mg p.o. twice daily.  -She does not have recent thyroid function test to review.  Her thyroid uptake and scan was from 2009, when it showed uniform uptake of 73% in 24 hours consistent with Graves' disease. -I advised her to discontinue methimazole during her last visit to repeat thyroid function tests. -She does not have acute complaints at this time.  She denies heat/cold intolerance, tremors, palpitations, recent weight changes. -She has history of goiter, did not require intervention. She has unidentified thyroid dysfunction in 1 of her sisters.  Review of Systems  Constitutional: + steady weight, no fatigue, no subjective hyperthermia, no subjective hypothermia Eyes: no blurry vision, no xerophthalmia ENT: no sore throat, + goiter , no dysphagia/odynophagia,  no hoarseness Cardiovascular: no Chest Pain, no Shortness of Breath, no palpitations, no leg swelling Respiratory: no cough, no SOB Gastrointestinal: no Nausea/Vomiting/Diarhhea Musculoskeletal: no muscle/joint aches Skin: no rashes Neurological: no tremors, no numbness, no tingling, no dizziness Psychiatric: no depression, no anxiety  Objective:    BP 132/85   Pulse 91   Ht  (1.727 m)   Wt 240 lb (108.9 kg)   BMI 36.49 kg/m   Wt Readings from Last 3 Encounters:  08/03/17 240 lb (108.9 kg)  07/05/17 239 lb (108.4 kg)  06/22/17 238 lb 9.6 oz (108.2 kg)    Physical Exam  Constitutional: + Obese, not in acute distress, normal state of mind Eyes: PERRLA, EOMI, no exophthalmos ENT: moist mucous membranes, + thyromegaly , no cervical lymphadenopathy  Musculoskeletal: no gross deformities, strength  intact in all four extremities Skin: moist, warm, no rashes Neurological: no tremor with outstretched hands, deep tendon reflexes are normal in bilateral lower extremities.    CMP ( most recent) CMP     Component Value Date/Time   NA 141 03/12/2017 1811   K 3.6 03/12/2017 1811   CL 105 03/12/2017 1811   CO2 29 03/12/2017 1811   GLUCOSE 98 03/12/2017 1811   BUN 7 03/12/2017 1811   CREATININE 0.82 03/12/2017 1811   CALCIUM 9.1 03/12/2017 1811   GFRNONAA >60 03/12/2017 1811   GFRAA >60 03/12/2017 1811    Recent Results (from the past 2160 hour(s))  Thyroid peroxidase antibody     Status: Abnormal   Collection Time: 07/12/17  3:57 PM  Result Value Ref Range   Thyroperoxidase Ab SerPl-aCnc >900 (H) <9 IU/mL  Thyroglobulin antibody     Status: Abnormal   Collection Time: 07/12/17  3:57 PM  Result Value Ref Range   Thyroglobulin Ab 2 (H) < or = 1 IU/mL  T3, free     Status: None   Collection Time: 07/12/17  3:57 PM  Result Value Ref Range   T3, Free 2.8 2.3 - 4.2 pg/mL  T4, free     Status: None   Collection Time: 07/12/17  3:57 PM  Result Value Ref Range   Free T4 1.2 0.8 - 1.8 ng/dL  TSH     Status: None   Collection Time: 07/12/17  3:57 PM  Result Value Ref Range   TSH 0.72 mIU/L    Comment:           Reference Range .           > or = 20 Years  0.40-4.50 .                Pregnancy Ranges           First trimester    0.26-2.66           Second trimester   0.55-2.73           Third trimester    0.43-2.91      Assessment & Plan:   1. 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  This is indicated for sufficient treatment with methimazole over the years.  -I have advised her to stay off of methimazole or any other antithyroid medication for now with plan to repeat thyroid function test with office visit in 3 to 4 months.  She is advised to return sooner if she  develops palpitations, tremors, worsening sweating/hot flashes. -If her repeat labs are  still suggestive of hyperthyroidism, the best course of action for her would be thyroid ablation with I-131 preceded by repeat thyroid uptake and scan. - I did not initiate any new prescriptions today.   - I advised patient to maintain close follow up with Dorena Bodo, PA-C for primary care needs.  Follow up plan: Return in about 4 months (around 12/04/2017) for follow up with pre-visit labs.   Marquis Lunch, MD [facility] [address] [place], [place] [address] Phone: [phone]  Fax: [phone]     08/03/2017, 1:55 PM  This note was partially dictated with voice recognition software. Similar sounding words can be transcribed inadequately or may not  be corrected upon review.

## 2017-08-17 ENCOUNTER — Other Ambulatory Visit: Payer: Self-pay | Admitting: Physician Assistant

## 2017-08-17 ENCOUNTER — Ambulatory Visit: Payer: Medicaid Other | Admitting: "Endocrinology

## 2017-08-17 DIAGNOSIS — I10 Essential (primary) hypertension: Secondary | ICD-10-CM

## 2017-08-19 ENCOUNTER — Ambulatory Visit: Payer: Medicaid Other | Admitting: Family Medicine

## 2017-08-19 ENCOUNTER — Other Ambulatory Visit: Payer: Self-pay

## 2017-08-19 ENCOUNTER — Encounter: Payer: Self-pay | Admitting: Family Medicine

## 2017-08-19 VITALS — BP 118/76 | HR 97 | Temp 98.8°F | Ht 68.0 in | Wt 239.4 lb

## 2017-08-19 DIAGNOSIS — Z01810 Encounter for preprocedural cardiovascular examination: Secondary | ICD-10-CM

## 2017-08-19 DIAGNOSIS — K047 Periapical abscess without sinus: Secondary | ICD-10-CM | POA: Diagnosis not present

## 2017-08-19 DIAGNOSIS — L03211 Cellulitis of face: Secondary | ICD-10-CM | POA: Diagnosis not present

## 2017-08-19 DIAGNOSIS — I10 Essential (primary) hypertension: Secondary | ICD-10-CM | POA: Diagnosis not present

## 2017-08-19 MED ORDER — AMOXICILLIN-POT CLAVULANATE 875-125 MG PO TABS: 1.0000 | ORAL_TABLET | Freq: Two times a day (BID) | ORAL | 0 refills | Status: DC

## 2017-08-19 NOTE — Progress Notes (Signed)
Patient ID: Cheyenne Campbell, female    DOB: [birth year], 47 y.o.   MRN: [id number]  PCP: Dorena Bodoixon, Mary B, PA-C  Chief Complaint  Patient presents with  . Dental Pain    Patient in with c/o swelling to mouth. States no pain. Is unable to see dentist unitil she gets a medical clearance for hypertension.    Subjective:   Cheyenne Campbell is a 47 y.o. female, presents to clinic with CC of dental abscess and infection, ongoing for more than a year, recurrent, swelling began to worsen about 4 days ago, her pain is controlled with tramadol she had leftover at home from her last ER visit for the same complaint that was approximately 1 month ago.  She has been having difficulty getting oral surgery scheduled because of elevated blood pressures when she has gone in for evaluation.  She states that she needs to start antibiotics, this is what her dentist or oral surgeon told her, so that when she follows up with him early next week they are able to do the procedure.  She also states that they have requested medical clearance for her hypertension.    Dental infections particularly in the same area of her left maxillary sinus with abscess to left cheek and adjacent to nose, has been ongoing since last year.  She has had multiple rounds of antibiotics, most recently she had clindamycin and Flagyl.  Patient's blood pressure over the past few visits, she started 5 mg of amlodipine, has been working on [employer] diet and exercise. 06/07/17:  150/108 06/08/17:  142/90 07/05/17:  134/86 08/03/17:  132/85 08/19/17:  118/76  Pt has history of hypothyroidism, did complete the methimazole treatment and is going to follow-up with endocrinology in 4 months for recheck.  She denies any chest pain, palpitations, near-syncope, shortness of breath, lower extremity edema, headaches, change in urine output.    Patient Active Problem List   Diagnosis Date Noted  . Graves' disease 08/03/2017  . Essential hypertension  06/08/2017  . Hyperthyroidism 06/08/2017     Prior to Admission medications   Medication Sig Start Date End Date Taking? Authorizing Provider  amLODipine (NORVASC) 5 MG tablet TAKE 1 TABLET BY MOUTH EVERY DAY 08/17/17  Yes Allayne Butcherixon, Mary B, PA-C  amoxicillin-clavulanate (AUGMENTIN) 875-125 MG tablet Take 1 tablet by mouth 2 (two) times daily. 08/19/17   Danelle Berryapia, Robynne Roat, PA-C     No Known Allergies   No family history on file.   Social History   Socioeconomic History  . Marital status: Single    Spouse name: Not on file  . Number of children: Not on file  . Years of education: Not on file  . Highest education level: Not on file  Occupational History  . Not on file  Social Needs  . Financial resource strain: Not on file  . Food insecurity:    Worry: Not on file    Inability: Not on file  . Transportation needs:    Medical: Not on file    Non-medical: Not on file  Tobacco Use  . Smoking status: Never Smoker  . Smokeless tobacco: Never Used  Substance and Sexual Activity  . Alcohol use: No  . Drug use: No  . Sexual activity: Not on file  Lifestyle  . Physical activity:    Days per week: Not on file    Minutes per session: Not on file  . Stress: Not on file  Relationships  . Social connections:    Talks  on phone: Not on file    Gets together: Not on file    Attends religious service: Not on file    Active member of club or organization: Not on file    Attends meetings of clubs or organizations: Not on file    Relationship status: Not on file  . Intimate partner violence:    Fear of current or ex partner: Not on file    Emotionally abused: Not on file    Physically abused: Not on file    Forced sexual activity: Not on file  Other Topics Concern  . Not on file  Social History Narrative  . Not on file     Review of Systems  Constitutional: Negative.   Eyes: Negative.   All other systems reviewed and are negative.      Objective:    Vitals:   08/19/17 1428    BP: 118/76  Pulse: 97  Temp: 98.8 F (37.1 C)  TempSrc: Oral  SpO2: 98%  Weight: 239 lb 6 oz (108.6 kg)  Height: 5\' 8"  (1.727 m)      Physical Exam  Constitutional: She is oriented to person, place, and time. She appears well-developed and well-nourished.  Non-toxic appearance. No distress.  HENT:  Head: Normocephalic and atraumatic. Head is without right periorbital erythema and without left periorbital erythema.  Right Ear: External ear normal.  Left Ear: External ear normal.  Nose:    Mouth/Throat: Uvula is midline, oropharynx is clear and moist and mucous membranes are normal. Mucous membranes are not pale, not dry and not cyanotic. No trismus in the jaw. Dental caries present. No uvula swelling. No oropharyngeal exudate, posterior oropharyngeal edema, posterior oropharyngeal erythema or tonsillar abscesses. Tonsils are 0 on the right. Tonsils are 0 on the left. No tonsillar exudate.  Nose is deviated to the right secondary to edema, left cheek is mildly edematous, enlarged and asymmetrical when compared to right side of her face, no erythema  Encircled area on image is where there is swelling, tenderness and abscess deep to this tissue, no fluctuance palpated to left upper alveolar mucosa or buccal mucosa, no fluctuance, edema or suspected abscess palpated throughout oral cavity, vast dental decay, few remaining teeth that are cracked with multiple caries, most decay is visible extending into the gumline where teeth are missing  No trismus, no stridor, posterior oropharynx normal in appearance of uvula midline, no edema, erythema or exudate, no sublingual tenderness to palpation, no lymphadenopathy under jawline or cervical  Eyes: Pupils are equal, round, and reactive to light. Conjunctivae, EOM and lids are normal. No scleral icterus.  Neck: Trachea normal, normal range of motion and phonation normal. Neck supple. No spinous process tenderness and no muscular tenderness present. No  neck rigidity. No tracheal deviation, no edema, no erythema and normal range of motion present. No thyroid mass present.  Cardiovascular: Normal rate, regular rhythm, normal heart sounds, intact distal pulses and normal pulses. Exam reveals no gallop and no friction rub.  No murmur heard. Pulses:      Radial pulses are 2+ on the right side, and 2+ on the left side.       Posterior tibial pulses are 2+ on the right side, and 2+ on the left side.  Pulmonary/Chest: Effort normal and breath sounds normal. No stridor. No respiratory distress. She has no wheezes. She has no rhonchi. She has no rales. She exhibits no tenderness.  Abdominal: Soft. Normal appearance and bowel sounds are normal. She exhibits no  distension. There is no tenderness. There is no rebound and no guarding.  Musculoskeletal: Normal range of motion. She exhibits no edema or deformity.  Lymphadenopathy:       Head (right side): No submandibular, no tonsillar, no preauricular, no posterior auricular and no occipital adenopathy present.       Head (left side): No submandibular, no tonsillar, no preauricular, no posterior auricular and no occipital adenopathy present.    She has no cervical adenopathy.  Neurological: She is alert and oriented to person, place, and time. She exhibits normal muscle tone. Gait normal.  Skin: Skin is warm, dry and intact. Capillary refill takes less than 2 seconds. No rash noted. She is not diaphoretic. No pallor.  Psychiatric: She has a normal mood and affect. Her speech is normal and behavior is normal.  Nursing note and vitals reviewed.         Assessment & Plan:      ICD-10-CM   1. Dental abscess K04.7   2. Well-controlled hypertension I10   3. Encounter for pre-operative cardiovascular clearance Z01.810    for clearance re: HTN, which is now well controlled and at goal    Needs clearance sent to [facility]?)   Patient presents for clearance regarding her hypertension, so she can  have dental surgery with anesthesia for multiple extractions/suspect multiple root canals or drainage of periapical abscesses with sinus tracts causing facial cellulitis.  On exam she does have a likely abscess/cellulitis to the left maxilla base of nose extending into her cheek, without any clear fluctuance or abnormal place to do I&D from inside of her mouth.  Reviewing chart this is been recurrent, CT maxillofacial with contrast was done March 12, 2017 which showed same infection.  She has had improvement several times with oral antibiotics, currently has no systemic symptoms, nothing concerning for Ludwig's angina.  She denies any pain, and vital signs are stable she is very well-appearing.  She is working on getting her blood pressure under control, so she can have surgery done.  Most recently she has had clindamycin, will change to Augmentin.  Blood pressure appears well-controlled today, sbp 118, at and below goal with only 5 mg norvasc daily.  She has no concerning cardiac signs or symptoms.  Feel blood pressure should not be a risk factor or hindrance to treating her dental decay and infections.    She does have follow-up with the dentist/oral surgeon early next week.  She will reach out to [place] regarding date of surgery.  I will consult patient's PCP are completed clearance and have it ready for the patient.  Danelle Berry, PA-C 08/19/17 3:11 PM

## 2017-09-13 ENCOUNTER — Ambulatory Visit: Payer: Medicaid Other | Admitting: "Endocrinology

## 2017-09-19 ENCOUNTER — Other Ambulatory Visit: Payer: Self-pay | Admitting: Physician Assistant

## 2017-09-19 DIAGNOSIS — I10 Essential (primary) hypertension: Secondary | ICD-10-CM

## 2017-09-22 ENCOUNTER — Encounter: Payer: Self-pay | Admitting: Physician Assistant

## 2017-09-22 ENCOUNTER — Ambulatory Visit (INDEPENDENT_AMBULATORY_CARE_PROVIDER_SITE_OTHER): Payer: Medicaid Other | Admitting: Physician Assistant

## 2017-09-22 VITALS — BP 134/80 | HR 88 | Temp 98.0°F | Resp 16 | Ht 68.0 in | Wt 244.2 lb

## 2017-09-22 DIAGNOSIS — J029 Acute pharyngitis, unspecified: Secondary | ICD-10-CM | POA: Diagnosis not present

## 2017-09-22 DIAGNOSIS — E0500 Thyrotoxicosis with diffuse goiter without thyrotoxic crisis or storm: Secondary | ICD-10-CM | POA: Diagnosis not present

## 2017-09-22 DIAGNOSIS — E0590 Thyrotoxicosis, unspecified without thyrotoxic crisis or storm: Secondary | ICD-10-CM | POA: Diagnosis not present

## 2017-09-22 DIAGNOSIS — I10 Essential (primary) hypertension: Secondary | ICD-10-CM

## 2017-09-22 NOTE — Progress Notes (Signed)
Patient ID: Cheyenne Campbell MRN: [id number], DOB: 1971/02/20, 47 y.o. Date of Encounter: 09/22/2017, 4:51 PM    Chief Complaint:  Chief Complaint  Patient presents with  . Sore Throat  . 3 month follow up with blood pressure     HPI: 47 y.o. year old female presents with above.     06/08/2017: I reviewed chart.  There is a staff note from yesterday that states: "Patient came in today stating that her BP was elevated when she went to the dentist for an abscess tooth. Checked patient BP after she sat for about 10 minutes. Patient's BP was 150/108. Advised patient to schedule appointment with Cheyenne BodoMary B Bronsyn Shappell, PA tomorrow.Patient is scheduled to see Cheyenne BodoMary B Avett Reineck, PA at 12:00 pm. Advised patient if blood pressure increases or severe headache, chest pain, or SOB occurs to go to the ER immediately. Patient verbalized understanding. "  Also today when I reviewed her medication list I can see that 03/12/17 Norvasc 5 mg daily was prescribed by a ER Physician. Today patient reports that that was prescribed from the ER but that it had a sticker on it stating that it could cause dizziness so she was scared to take it any time that she was going to need to be driving her kids to school.  She ended up only taking it for a couple of days and then did not take it because of that concern and then forgot about it and just has not been taking it.  Also she does have questions about diet and exercise recommendations to help control her blood pressure and also to help lose some weight.  She reports that in addition to working at [employer] she also is working at [employer] in the evenings from 6-10. Says that she is on her feet and does a lot of walking and movement with her job.  Also has been trying to drink lots of water.   She also reports that in the past we had referred her to Cheyenne Campbell for her thyroid but she recently received notification that he is retiring so she needs to see a new doctor to  manage her thyroid.  No other concerns to address today.   A/P AT THAT OV 06/08/2017:  1. Essential hypertension Reassured her that this will not cause significant dizziness.  Recommend that she take it each morning.  She is agreeable to resume this medication and return to recheck BP in 2 weeks. Also discussed that cardiovascular exercise is the type of exercise that will help reduce blood pressure and help with weight management.  Recommend walking as much as possible even at her job.  Also discussed low-sodium diet and foods to avoid.  Specifically discussed some foods that seem like they would be healthy but do have high sodium including soups and canned foods frozen dinner such as lean cuisine.  Also any type of processed meats and pork such as bacon and barbecue. - amLODipine (NORVASC) 5 MG tablet; Take 1 tablet (5 mg total) by mouth daily.  Dispense: 30 tablet; Refill: 0  2. Hyperthyroidism I  will put in referral for her to establish with a new endocrinologist. - Ambulatory referral to Endocrinology      06/22/2017: Today she reports that she has been taking the Norvasc 5 mg daily.  She is having no lightheadedness or other adverse effects.  She has not had her blood pressure checked anywhere since last visit here. Asked about diet and exercise changes  and she says "oh, yes" that she is doing that-- but then the only thing she names off is doing some exercises in the floor in the mornings. States that she does have an appointment to see a new endocrinologist in about 2 weeks.   09/22/2017:  Today I reviewed that since her last visit with me she had 1 of the visit here with Cheyenne Mesi, PA.  Was for evaluation of blood pressure to prepare for oral surgery. Today patient reports that she never heard anything else from the oral surgeon and has not had that surgery performed yet.  States that she will go home and call them to schedule follow-up. Continues to take the Norvasc daily.  This  is causing no lightheadedness no lower extremity edema no other adverse effects.  Today she reports that she has had a sore throat for the last 1 to 2 days.  Says that at night her nose feels stuffed up but otherwise has had no rhinorrhea or mucus from the nose.  Says that today her nose is feeling better.  Just has some sore throat when swallows.  Has had no chest congestion or cough.  No fevers or chills.  Asked if her children have been sick.  She says that her daughter has a little cough.  Today I reviewed her chart in fact that she has had no recent lipid panel or other preventive care.  Discussed this with her.  Also she has no gynecologist and has not been having any GYN exam either.  She is agreeable to return fasting for CPE next week.    Home Meds:   Outpatient Medications Prior to Visit  Medication Sig Dispense Refill  . amLODipine (NORVASC) 5 MG tablet TAKE 1 TABLET BY MOUTH EVERY DAY 30 tablet 0  . amoxicillin-clavulanate (AUGMENTIN) 875-125 MG tablet Take 1 tablet by mouth 2 (two) times daily. 20 tablet 0   No facility-administered medications prior to visit.     Allergies: No Known Allergies    Review of Systems: See HPI for pertinent ROS. All other ROS negative.     Physical Exam: Blood pressure 134/80, pulse 88, temperature 98 F (36.7 C), temperature source Oral, resp. rate 16, height 5\' 8"  (1.727 m), weight 110.8 kg (244 lb 3.2 oz), SpO2 98 %., Body mass index is 37.13 kg/m. General: AAF. Appears in no acute distress. Head: Normocephalic, atraumatic, eyes without discharge, sclera non-icteric, nares are without discharge. Bilateral auditory canals clear, TM's are without perforation, pearly grey and translucent with reflective cone of light bilaterally. Oral cavity moist, posterior pharynx without exudate, erythema.  Neck: Supple. No thyromegaly. No lymphadenopathy. Lungs: Clear bilaterally to auscultation without wheezes, rales, or rhonchi. Breathing is  unlabored. Heart: RRR with S1 S2. No murmurs, rubs, or gallops. Musculoskeletal:  Strength and tone normal for age. Extremities/Skin: Warm and dry.  Neuro: Alert and oriented X 3. Moves all extremities spontaneously. Gait is normal. CNII-XII grossly in tact. Psych:  Responds to questions appropriately with a normal affect.     ASSESSMENT AND PLAN:  47 y.o. year old female with   Essential hypertension 09/22/2017: Blood Pressure is controlled.  Continue Norvasc 5 mg daily.    Hyperthyroidism 06/22/2017: She reports that she has heard about the endocrinology appointment and has appointment to see a new endocrinologist for about 2 weeks from now. 09/22/2017: Is now being managed by Cheyenne Campbell   Sore throat Rapid strep test negative.  Discussed with patient that exam consistent with viral  illness.  Recommend use lozenges and spray, Tylenol Motrin if needed for symptom relief.  Otherwise this should run its course and resolve on its own.  If develops fever or symptoms worsen significantly or if symptoms persist greater than 7 to 10 days then follow-up. - STREP GROUP A AG, W/REFLEX TO CULT  She is agreeable to schedule complete physical exam with me.  She is scheduling for early morning so she can come fasting to check lipid panel.  Scheduling for next week.   [address] [place], [place], [facility] 09/22/2017 4:51 PM

## 2017-09-24 LAB — CULTURE, GROUP A STREP
MICRO NUMBER: 90822659
SPECIMEN QUALITY:: ADEQUATE

## 2017-09-24 LAB — STREP GROUP A AG, W/REFLEX TO CULT: STREPTOCOCCUS, GROUP A SCREEN (DIRECT): NOT DETECTED

## 2017-09-29 ENCOUNTER — Ambulatory Visit (INDEPENDENT_AMBULATORY_CARE_PROVIDER_SITE_OTHER): Payer: Medicaid Other | Admitting: Physician Assistant

## 2017-09-29 ENCOUNTER — Encounter: Payer: Self-pay | Admitting: Physician Assistant

## 2017-09-29 ENCOUNTER — Other Ambulatory Visit: Payer: Self-pay

## 2017-09-29 VITALS — BP 142/108 | HR 86 | Temp 98.1°F | Resp 16 | Ht 69.0 in | Wt 242.0 lb

## 2017-09-29 DIAGNOSIS — Z0000 Encounter for general adult medical examination without abnormal findings: Secondary | ICD-10-CM | POA: Diagnosis not present

## 2017-09-29 DIAGNOSIS — E0500 Thyrotoxicosis with diffuse goiter without thyrotoxic crisis or storm: Secondary | ICD-10-CM

## 2017-09-29 DIAGNOSIS — E0590 Thyrotoxicosis, unspecified without thyrotoxic crisis or storm: Secondary | ICD-10-CM | POA: Diagnosis not present

## 2017-09-29 DIAGNOSIS — Z23 Encounter for immunization: Secondary | ICD-10-CM | POA: Diagnosis not present

## 2017-09-29 DIAGNOSIS — I10 Essential (primary) hypertension: Secondary | ICD-10-CM

## 2017-09-29 DIAGNOSIS — K219 Gastro-esophageal reflux disease without esophagitis: Secondary | ICD-10-CM | POA: Insufficient documentation

## 2017-09-29 LAB — CBC WITH DIFFERENTIAL/PLATELET
BASOS ABS: 10 {cells}/uL (ref 0–200)
BASOS PCT: 0.2 %
Eosinophils Absolute: 0 cells/uL — ABNORMAL LOW (ref 15–500)
Eosinophils Relative: 0 %
HCT: 34.8 % — ABNORMAL LOW (ref 35.0–45.0)
HEMOGLOBIN: 11.6 g/dL — AB (ref 11.7–15.5)
Lymphs Abs: 1575 cells/uL (ref 850–3900)
MCH: 26.4 pg — ABNORMAL LOW (ref 27.0–33.0)
MCHC: 33.3 g/dL (ref 32.0–36.0)
MCV: 79.3 fL — AB (ref 80.0–100.0)
MPV: 9.6 fL (ref 7.5–12.5)
Monocytes Relative: 12.1 %
NEUTROS ABS: 2810 {cells}/uL (ref 1500–7800)
Neutrophils Relative %: 56.2 %
Platelets: 358 10*3/uL (ref 140–400)
RBC: 4.39 10*6/uL (ref 3.80–5.10)
RDW: 13.6 % (ref 11.0–15.0)
TOTAL LYMPHOCYTE: 31.5 %
WBC: 5 10*3/uL (ref 3.8–10.8)
WBCMIX: 605 {cells}/uL (ref 200–950)

## 2017-09-29 LAB — COMPLETE METABOLIC PANEL WITH GFR
AG RATIO: 1 (calc) (ref 1.0–2.5)
ALBUMIN MSPROF: 4.1 g/dL (ref 3.6–5.1)
ALT: 22 U/L (ref 6–29)
AST: 17 U/L (ref 10–35)
Alkaline phosphatase (APISO): 89 U/L (ref 33–115)
BILIRUBIN TOTAL: 0.6 mg/dL (ref 0.2–1.2)
BUN: 8 mg/dL (ref 7–25)
CALCIUM: 9.5 mg/dL (ref 8.6–10.2)
CO2: 31 mmol/L (ref 20–32)
Chloride: 101 mmol/L (ref 98–110)
Creat: 0.78 mg/dL (ref 0.50–1.10)
GFR, EST AFRICAN AMERICAN: 104 mL/min/{1.73_m2} (ref >=60)
GFR, EST NON AFRICAN AMERICAN: 90 mL/min/{1.73_m2} (ref >=60)
GLOBULIN: 4.2 g/dL — AB (ref 1.9–3.7)
Glucose, Bld: 85 mg/dL (ref 65–99)
Potassium: 3.9 mmol/L (ref 3.5–5.3)
SODIUM: 139 mmol/L (ref 135–146)
TOTAL PROTEIN: 8.3 g/dL — AB (ref 6.1–8.1)

## 2017-09-29 LAB — LIPID PANEL
Cholesterol: 149 mg/dL (ref <200)
HDL: 58 mg/dL (ref >50)
LDL Cholesterol (Calc): 74 mg/dL (calc)
NON-HDL CHOLESTEROL (CALC): 91 mg/dL (ref <130)
TRIGLYCERIDES: 85 mg/dL (ref <150)
Total CHOL/HDL Ratio: 2.6 (calc) (ref <5.0)

## 2017-09-29 MED ORDER — OMEPRAZOLE 20 MG PO CPDR: 20.0000 mg | DELAYED_RELEASE_CAPSULE | Freq: Every day | ORAL | 3 refills | Status: DC

## 2017-09-29 NOTE — Progress Notes (Signed)
Patient ID: Cheyenne Campbell MRN: [id number], DOB: October 30, 1970, 47 y.o. Date of Encounter: 09/29/2017,   Chief Complaint: Physical (CPE)  HPI: 47 y.o. y/o female  here for CPE.   I reviewed her chart.   06/08/2017--- OV with me --- She had called, reporting that BP was elevated when she went to dentist.  Came here for nurse to check BP--BP was 150/108.She was then scheduled to see me the following day for OV ( 06/08/2017) At that visit I also reviewed that 03/12/2017 --- Norvasc 5 mg had been prescribed by ER physician. At her visit with me 05/2017 she reported that the medicine was prescribed from the ER but the medication had a sticker on it stating that it could cause dizziness and she was scared to take it especially if she was going to be driving her kids to school etc. At that visit 05/2017 reassured her that this would not cause significant dizziness.  Restarted Norvasc 5 mg daily.  At that visit she also reported that in the past we had referred her to Dr. Margaretmary Bayley for her thyroid but she had recently received notification that he was retiring and needed referral to a new endocrinologist to manage her thyroid.  At that visit she also reported that in addition to working at [employer], she was also working at [employer] in the evenings from 6-10.     ------------------------------------------------------------------------------------------------------------------------------------------------------------------------------------------------------------------------------------------------ She then had follow-up visit with me 06/22/2017. Reported that she was taking the Norvasc 5 mg daily.  Was having no lightheadedness or other adverse effects.  She had not checked her blood pressure anywhere since her last visit  here.  ---------------------------------------------------------------------------------------------------------------------------------------------------------------------------------------------------------------------------------------------- She had another visit with me 09/22/2017.  At that point she continued to take the Norvasc with no adverse effects. At that visit we reviewed that she had no recent lipid panel or other preventive care.  Discussed this with her.  She also reported that she had been seeing a gynecologist and has been having no GYN exam either.  She was agreeable to return fasting for CPE.  She now presents for that CPE.  Today she does report that she has recently been having some symptoms that sound consistent with GERD.  Is to epigastric region and up center of chest and states that she has been feeling some discomfort there after she eats.  Also states that she has been having some belching. She has no other specific concerns to address today.   Review of Systems: Consitutional: No fever, chills, fatigue, night sweats, lymphadenopathy. No significant/unexplained weight changes. Eyes: No visual changes, eye redness, or discharge. ENT/Mouth: No ear pain, sore throat, nasal drainage, or sinus pain. Cardiovascular: No chest pressure,heaviness, tightness or squeezing, even with exertion. No increased shortness of breath or dyspnea on exertion.No palpitations, edema, orthopnea, PND. Respiratory: No cough, hemoptysis, SOB, or wheezing. Gastrointestinal: No anorexia, dysphagia, reflux, pain, nausea, vomiting, hematemesis, diarrhea, constipation, BRBPR, or melena. Breast: No mass, nodules, bulging, or retraction. No skin changes or inflammation. No nipple discharge. No lymphadenopathy. Genitourinary: No dysuria, hematuria, incontinence, vaginal discharge, pruritis, burning, abnormal bleeding, or pain. Musculoskeletal: No decreased ROM, No joint pain or swelling. No significant  pain in neck, back, or extremities. Skin: No rash, pruritis, or concerning lesions. Neurological: No headache, dizziness, syncope, seizures, tremors, memory loss, coordination problems, or paresthesias. Psychological: No anxiety, depression, hallucinations, SI/HI. Endocrine: No polydipsia, polyphagia, polyuria, or known diabetes.No increased fatigue. No palpitations/rapid heart rate. No significant/unexplained weight change. All other systems were reviewed  and are otherwise negative.  Past Medical History:  Diagnosis Date  . Thyroid disease      History reviewed. No pertinent surgical history.  Home Meds:  Outpatient Medications Prior to Visit  Medication Sig Dispense Refill  . amLODipine (NORVASC) 5 MG tablet TAKE 1 TABLET BY MOUTH EVERY DAY 30 tablet 0   No facility-administered medications prior to visit.     Allergies: No Known Allergies  Social History   Socioeconomic History  . Marital status: Single    Spouse name: Not on file  . Number of children: Not on file  . Years of education: Not on file  . Highest education level: Not on file  Occupational History  . Not on file  Social Needs  . Financial resource strain: Not on file  . Food insecurity:    Worry: Not on file    Inability: Not on file  . Transportation needs:    Medical: Not on file    Non-medical: Not on file  Tobacco Use  . Smoking status: Never Smoker  . Smokeless tobacco: Never Used  Substance and Sexual Activity  . Alcohol use: No  . Drug use: No  . Sexual activity: Not on file  Lifestyle  . Physical activity:    Days per week: Not on file    Minutes per session: Not on file  . Stress: Not on file  Relationships  . Social connections:    Talks on phone: Not on file    Gets together: Not on file    Attends religious service: Not on file    Active member of club or organization: Not on file    Attends meetings of clubs or organizations: Not on file    Relationship status: Not on file  .  Intimate partner violence:    Fear of current or ex partner: Not on file    Emotionally abused: Not on file    Physically abused: Not on file    Forced sexual activity: Not on file  Other Topics Concern  . Not on file  Social History Narrative  . Not on file    History reviewed. No pertinent family history.  No premature family history of CAD or CA. Actually both her mother and father are still living and neither of them have any significant medical problems.  Also she has 1 sister and 3 brothers and they are all healthy as far she is aware. she works at [employer].  She also works evenings at [employer].  She does have children.  Physical Exam: Blood pressure (!) 142/108, pulse 86, temperature 98.1 F (36.7 C), temperature source Oral, resp. rate 16, height 5\' 9"  (1.753 m), weight 109.8 kg (242 lb), SpO2 99 %., There is no height or weight on file to calculate BMI. General:  Obese AAF. Appears in no acute distress. HEENT: Normocephalic, atraumatic. Conjunctiva pink, sclera non-icteric. Pupils 2 mm constricting to 1 mm, round, regular, and equally reactive to light and accomodation. EOMI. Internal auditory canal clear. TMs with good cone of light and without pathology. Nasal mucosa pink. Nares are without discharge. No sinus tenderness. Oral mucosa pink. Neck: Supple. Trachea midline. No thyromegaly. Full ROM. No lymphadenopathy.No Carotid Bruits. Lungs: Clear to auscultation bilaterally without wheezes, rales, or rhonchi. Breathing is of normal effort and unlabored. Cardiovascular: RRR with S1 S2. No murmurs, rubs, or gallops. Distal pulses 2+ symmetrically. No carotid or abdominal bruits. Breast: Symmetrical. No masses. Nipples without discharge. Abdomen: Soft, non-tender, non-distended with normoactive  bowel sounds. No hepatosplenomegaly or masses. No rebound/guarding. No CVA tenderness. No hernias.  Genitourinary:  External genitalia without lesions. Vaginal mucosa pink.No discharge  present. Cervix pink and without discharge. No cervical tenderness.Normal uterus size. No adnexal mass or tenderness.  Pap smear taken Musculoskeletal: Full range of motion and 5/5 strength throughout. Without swelling, atrophy, tenderness, crepitus, or warmth. Extremities without clubbing, cyanosis, or edema. Calves supple. Skin: Warm and moist without erythema, ecchymosis, wounds, or rash. Neuro: A+Ox3. CN II-XII grossly intact. Moves all extremities spontaneously. Full sensation throughout. Normal gait. Psych:  Responds to questions appropriately with a normal affect.   Assessment/Plan:  47 y.o. y/o female here for CPE  1. Encounter for preventive health examination  A. Screening Labs: - CBC with Differential/Platelet - COMPLETE METABOLIC PANEL WITH GFR - Lipid panel  B. Pap: - PAP, Thin Prep w/HPV rflx HPV Type 16/18  C. Screening Mammogram: She has been continuing to have annual mammograms.  Last was 08/04/2016.  Negative. She reports that she had received a letter to schedule follow-up and she is aware and will schedule follow-up mammogram.  D. DEXA/BMD:  Will wait to do DEXA closer to age 50.  E. Colorectal Cancer Screening: No indication to need this until age 51  F. Immunizations:  Influenza:-------N/A Tetanus:----Has had no tetanus vaccine in the past 10 years.  She is agreeable to update this today.  Tdap given here 09/29/2017. Pneumococcal:--She has no indication to require pneumonia vaccine until age 27 Shingrx:-------- will discuss at age 51   2. Essential hypertension Blood Pressure is elevated but she has not taken her medication for today yet.  She is taking her medication now.  3. Hyperthyroidism This is managed by endocrinology.  Therefore I am not checking her TSH with labs above.  4. Graves' disease This is managed by endocrinology.  Therefore I am not checking her TSH with labs above.   5. Gastroesophageal reflux disease, esophagitis presence not  specified We will have her start omeprazole 20 mg daily.  If symptoms do not resolve with this, then follow-up for further evaluation. - omeprazole (PRILOSEC) 20 MG capsule; Take 1 capsule (20 mg total) by mouth daily.  Dispense: 30 capsule; Refill: 3   Will plan follow-up visit 6 months.  Follow-up sooner if needed.   Murray Hodgkins [place], [place], [facility] 09/29/2017 7:56 AM

## 2017-09-29 NOTE — Addendum Note (Signed)
Addended by: Phineas SemenJOHNSON, Minah Axelrod A on: 09/29/2017 08:45 AM   Modules accepted: Orders

## 2017-10-03 LAB — PAP, TP IMAGING W/ HPV RNA, RFLX HPV TYPE 16,18/45: HPV DNA High Risk: NOT DETECTED

## 2017-10-25 ENCOUNTER — Other Ambulatory Visit: Payer: Self-pay | Admitting: Physician Assistant

## 2017-10-25 DIAGNOSIS — I10 Essential (primary) hypertension: Secondary | ICD-10-CM

## 2017-11-03 ENCOUNTER — Encounter: Payer: Self-pay | Admitting: Physician Assistant

## 2017-11-03 ENCOUNTER — Ambulatory Visit (INDEPENDENT_AMBULATORY_CARE_PROVIDER_SITE_OTHER): Payer: Medicaid Other | Admitting: Physician Assistant

## 2017-11-03 VITALS — BP 140/96 | HR 103 | Temp 98.7°F | Resp 16 | Wt 238.0 lb

## 2017-11-03 DIAGNOSIS — M25562 Pain in left knee: Secondary | ICD-10-CM | POA: Diagnosis not present

## 2017-11-03 DIAGNOSIS — K047 Periapical abscess without sinus: Secondary | ICD-10-CM

## 2017-11-03 MED ORDER — AMOXICILLIN 500 MG PO CAPS: 500.0000 mg | ORAL_CAPSULE | Freq: Three times a day (TID) | ORAL | 0 refills | Status: AC

## 2017-11-03 NOTE — Progress Notes (Signed)
Patient ID: Cheyenne JarvisYvette Soberanes MRN: [id number], DOB: [birth year], 47 y.o. Date of Encounter: 11/03/2017, 11:53 AM    Chief Complaint:  Chief Complaint  Patient presents with  . Leg Pain    Patient has c/o pain to left leg.      HPI: 47 y.o. year old female presents with above.   Also, she reports that she still has not gotten her tooth fixed.  States that she had too many other things going on and had to put it off.  The tooth is now bothering her again.  Wants to see if I can give her another round of antibiotic to take.  States that she is going back to the dentist today to schedule them fixing the tooth.  She will follow-up with them today to get that scheduled.    Also reports that she has been having pain and swelling in the left knee.  Is a Conservation officer, naturecashier at [employer].  Is standing on her feet for long periods of time and standing still.  Standing still like that she feels pain in her knee.  States that when she has to walk up steps this causes significant pain in the knee.  When she has to go up steps she bears most of the weight on her right leg so that she is not putting some much weight on that left knee as she is going up the steps.  States that when she is home from work she has been elevating and placing ice on the knee.  Had no known injury or trauma to the knee.     Home Meds:   Outpatient Medications Prior to Visit  Medication Sig Dispense Refill  . amLODipine (NORVASC) 5 MG tablet TAKE 1 TABLET BY MOUTH EVERY DAY 30 tablet 0  . omeprazole (PRILOSEC) 20 MG capsule Take 1 capsule (20 mg total) by mouth daily. (Patient not taking: Reported on 11/03/2017) 30 capsule 3   No facility-administered medications prior to visit.     Allergies: No Known Allergies    Review of Systems: See HPI for pertinent ROS. All other ROS negative.    Physical Exam: Blood pressure (!) 140/96, pulse (!) 103, temperature 98.7 F (37.1 C), temperature source Oral, resp. rate 16, weight 108 kg, SpO2  99 %., Body mass index is 35.15 kg/m. General:  AAF. Appears in no acute distress. Neck: Supple. No thyromegaly. No lymphadenopathy. Lungs: Clear bilaterally to auscultation without wheezes, rales, or rhonchi. Breathing is unlabored. Heart: Regular rhythm. No murmurs, rubs, or gallops. Msk:  Strength and tone normal for age. Left Knee: She does have findings consistent with Baker's cyst of the posterior aspect of the left knee.  Anterior drawer is negative.  No laxity noted with varus and valgus maneuvers. Extremities/Skin: Warm and dry.  Neuro: Alert and oriented X 3. Moves all extremities spontaneously. Gait is normal. CNII-XII grossly in tact. Psych:  Responds to questions appropriately with a normal affect.     ASSESSMENT AND PLAN:  47 y.o. year old female with   1. Acute pain of left knee Will refer to orthopedics for further evaluation and management of this. - AMB referral to orthopedics  2. Tooth infection I will prescribe antibiotics to use temporarily.  But she must follow-up with specialist. - amoxicillin (AMOXIL) 500 MG capsule; Take 1 capsule (500 mg total) by mouth 3 (three) times daily for 5 days.  Dispense: 15 capsule; Refill: 0   Signed, [address] Beth [place], [place], [facility] 11/03/2017 11:53 AM

## 2017-11-18 DIAGNOSIS — M25562 Pain in left knee: Secondary | ICD-10-CM | POA: Diagnosis not present

## 2017-11-29 ENCOUNTER — Other Ambulatory Visit: Payer: Self-pay | Admitting: Physician Assistant

## 2017-11-29 DIAGNOSIS — I10 Essential (primary) hypertension: Secondary | ICD-10-CM

## 2017-12-05 ENCOUNTER — Ambulatory Visit: Payer: Medicaid Other | Admitting: "Endocrinology

## 2017-12-19 ENCOUNTER — Ambulatory Visit: Payer: Medicaid Other | Admitting: "Endocrinology

## 2017-12-20 ENCOUNTER — Ambulatory Visit: Payer: Medicaid Other | Admitting: "Endocrinology

## 2018-01-06 DIAGNOSIS — E0590 Thyrotoxicosis, unspecified without thyrotoxic crisis or storm: Secondary | ICD-10-CM | POA: Diagnosis not present

## 2018-01-06 LAB — T4, FREE: Free T4: 2.3 ng/dL — ABNORMAL HIGH (ref 0.8–1.8)

## 2018-01-06 LAB — TSH: TSH: 0.01 mIU/L — ABNORMAL LOW

## 2018-01-12 ENCOUNTER — Other Ambulatory Visit: Payer: Self-pay | Admitting: Physician Assistant

## 2018-01-12 DIAGNOSIS — I10 Essential (primary) hypertension: Secondary | ICD-10-CM

## 2018-01-24 ENCOUNTER — Other Ambulatory Visit: Payer: Self-pay | Admitting: Family Medicine

## 2018-01-24 DIAGNOSIS — Z1231 Encounter for screening mammogram for malignant neoplasm of breast: Secondary | ICD-10-CM

## 2018-02-14 ENCOUNTER — Ambulatory Visit (INDEPENDENT_AMBULATORY_CARE_PROVIDER_SITE_OTHER): Payer: Medicaid Other | Admitting: "Endocrinology

## 2018-02-14 ENCOUNTER — Encounter: Payer: Self-pay | Admitting: "Endocrinology

## 2018-02-14 VITALS — BP 124/82 | HR 94 | Ht 69.0 in | Wt 223.0 lb

## 2018-02-14 DIAGNOSIS — E0590 Thyrotoxicosis, unspecified without thyrotoxic crisis or storm: Secondary | ICD-10-CM | POA: Diagnosis not present

## 2018-02-14 NOTE — Progress Notes (Signed)
Endocrinology follow-up note                                            02/14/2018, 12:39 PM   Subjective:    Patient ID: Cheyenne Campbell, female    DOB: 26-Feb-1971, PCP Dorena Bodoixon, Mary B, PA-C   Past Medical History:  Diagnosis Date  . Thyroid disease    History reviewed. No pertinent surgical history. Social History   Socioeconomic History  . Marital status: Single    Spouse name: Not on file  . Number of children: Not on file  . Years of education: Not on file  . Highest education level: Not on file  Occupational History  . Not on file  Social Needs  . Financial resource strain: Not on file  . Food insecurity:    Worry: Not on file    Inability: Not on file  . Transportation needs:    Medical: Not on file    Non-medical: Not on file  Tobacco Use  . Smoking status: Never Smoker  . Smokeless tobacco: Never Used  Substance and Sexual Activity  . Alcohol use: No  . Drug use: No  . Sexual activity: Not on file  Lifestyle  . Physical activity:    Days per week: Not on file    Minutes per session: Not on file  . Stress: Not on file  Relationships  . Social connections:    Talks on phone: Not on file    Gets together: Not on file    Attends religious service: Not on file    Active member of club or organization: Not on file    Attends meetings of clubs or organizations: Not on file    Relationship status: Not on file  Other Topics Concern  . Not on file  Social History Narrative  . Not on file   Outpatient Encounter Medications as of 02/14/2018  Medication Sig  . amLODipine (NORVASC) 5 MG tablet TAKE 1 TABLET BY MOUTH EVERY DAY  . omeprazole (PRILOSEC) 20 MG capsule Take 1 capsule (20 mg total) by mouth daily. (Patient not taking: Reported on 11/03/2017)   No facility-administered encounter medications on file as of 02/14/2018.    ALLERGIES: No Known Allergies  VACCINATION STATUS: Immunization History  Administered Date(s) Administered  . Influenza  Split 11/01/2012  . Tdap 09/29/2017    HPI Cheyenne JarvisYvette Mansouri is 47 y.o. female who returns with repeat thyroid function tests.  She was previously treated with methimazole for hyperthyroidism from Graves' disease with clinical response. She was taken off of methimazole during her last visit due to the fact that she took this medication for more than 10 years and her presentation thyroid function tests were favorable.  -She does not have recent thyroid uptake and scan.  Her thyroid uptake and scan was from 2009, when it showed uniform uptake of 73% in 24 hours consistent with Graves' disease. -She presents with significant weight loss of 20 pounds, heat intolerance, on and off palpitations and tremors. -She has history of goiter. She has unidentified thyroid dysfunction in 1 of her sisters.  Review of Systems  Constitutional: +  Weight loss, + fatigue, + subjective hyperthermia Eyes: no blurry vision, no xerophthalmia ENT: no sore throat, + enlarged thyroid,  no dysphagia/odynophagia, no hoarseness Cardiovascular: no Chest Pain, no Shortness of Breath, no palpitations, no  leg swelling Musculoskeletal: no muscle/joint aches Skin: no rashes Neurological: no tremors, no numbness, no tingling, no dizziness Psychiatric: no depression, no anxiety  Objective:    BP 124/82   Pulse 94   Ht 5\' 9"  (1.753 m)   Wt 223 lb (101.2 kg)   BMI 32.93 kg/m   Wt Readings from Last 3 Encounters:  02/14/18 223 lb (101.2 kg)  11/03/17 238 lb (108 kg)  09/29/17 242 lb (109.8 kg)    Physical Exam  Constitutional: + Obese, not in acute distress, normal state of mind Eyes: PERRLA, EOMI, no exophthalmos ENT: moist mucous membranes, + palpable thyromegaly , no cervical lymphadenopathy  Musculoskeletal: no gross deformities, strength intact in all four extremities Skin: moist, warm, no rashes Neurological: + tremor with outstretched hands, deep tendon reflexes are normal in bilateral lower extremities.     CMP ( most recent) CMP     Component Value Date/Time   NA 139 09/29/2017 0841   K 3.9 09/29/2017 0841   CL 101 09/29/2017 0841   CO2 31 09/29/2017 0841   GLUCOSE 85 09/29/2017 0841   BUN 8 09/29/2017 0841   CREATININE 0.78 09/29/2017 0841   CALCIUM 9.5 09/29/2017 0841   PROT 8.3 (H) 09/29/2017 0841   AST 17 09/29/2017 0841   ALT 22 09/29/2017 0841   BILITOT 0.6 09/29/2017 0841   GFRNONAA 90 09/29/2017 0841   GFRAA 104 09/29/2017 0841    Recent Results (from the past 2160 hour(s))  T4, free     Status: Abnormal   Collection Time: 01/06/18  3:52 PM  Result Value Ref Range   Free T4 2.3 (H) 0.8 - 1.8 ng/dL  TSH     Status: Abnormal   Collection Time: 01/06/18  3:52 PM  Result Value Ref Range   TSH 0.01 (L) mIU/L    Comment:           Reference Range .           > or = 20 Years  0.40-4.50 .                Pregnancy Ranges           First trimester    0.26-2.66           Second trimester   0.55-2.73           Third trimester    0.43-2.91      Assessment & Plan:   1. Hx of Hyperthyroidism from Graves' disease -Her previsit thyroid function tests are consistent with relapse of hyperthyroidism. -She will need thyroid uptake and scan to confirm primary hyperthyroidism. -She will be considered for ablative treatment with I-131 if her thyroid uptake and scan is still  significant. -She will return in 10 -15 days for reevaluation. - I advised patient to maintain close follow up with Dorena Bodo, PA-C for primary care needs.  Follow up plan: Return in about 10 days (around 02/24/2018) for Follow up with Thyroid Uptake and Scan.   Marquis Lunch, MD [facility] [address] [place], [place] [address] Phone: [phone]  Fax: [phone]     02/14/2018, 12:39 PM  This note was partially dictated with voice recognition software. Similar sounding words can be transcribed inadequately or may not  be corrected upon  review.

## 2018-02-19 ENCOUNTER — Other Ambulatory Visit: Payer: Self-pay | Admitting: Family Medicine

## 2018-02-19 DIAGNOSIS — I10 Essential (primary) hypertension: Secondary | ICD-10-CM

## 2018-03-17 ENCOUNTER — Telehealth: Payer: Self-pay | Admitting: "Endocrinology

## 2018-03-17 ENCOUNTER — Other Ambulatory Visit: Payer: Self-pay | Admitting: "Endocrinology

## 2018-03-17 DIAGNOSIS — E0590 Thyrotoxicosis, unspecified without thyrotoxic crisis or storm: Secondary | ICD-10-CM

## 2018-03-20 NOTE — Telephone Encounter (Signed)
Error

## 2018-03-23 ENCOUNTER — Encounter (HOSPITAL_COMMUNITY): Payer: Medicaid Other

## 2018-03-24 ENCOUNTER — Encounter (HOSPITAL_COMMUNITY): Payer: Medicaid Other

## 2018-04-03 ENCOUNTER — Encounter (HOSPITAL_COMMUNITY): Payer: Medicaid Other | Attending: "Endocrinology

## 2018-04-03 ENCOUNTER — Encounter (HOSPITAL_COMMUNITY): Payer: Medicaid Other

## 2018-04-04 ENCOUNTER — Encounter (HOSPITAL_COMMUNITY): Payer: Medicaid Other

## 2018-04-06 ENCOUNTER — Ambulatory Visit: Payer: Medicaid Other | Admitting: Physician Assistant

## 2018-04-11 ENCOUNTER — Ambulatory Visit: Payer: Medicaid Other | Admitting: "Endocrinology

## 2018-04-12 DIAGNOSIS — M1712 Unilateral primary osteoarthritis, left knee: Secondary | ICD-10-CM | POA: Diagnosis not present

## 2018-04-24 ENCOUNTER — Encounter (HOSPITAL_COMMUNITY)
Admission: RE | Admit: 2018-04-24 | Discharge: 2018-04-24 | Disposition: A | Discharge status: Home / Self Care | Payer: Medicaid Other | Source: Ambulatory Visit | Attending: "Endocrinology | Admitting: "Endocrinology

## 2018-04-24 ENCOUNTER — Ambulatory Visit (HOSPITAL_COMMUNITY)
Admission: RE | Admit: 2018-04-24 | Discharge: 2018-04-24 | Disposition: A | Discharge status: Home / Self Care | Payer: Medicaid Other | Source: Ambulatory Visit | Attending: "Endocrinology | Admitting: "Endocrinology

## 2018-04-24 DIAGNOSIS — E0590 Thyrotoxicosis, unspecified without thyrotoxic crisis or storm: Secondary | ICD-10-CM | POA: Insufficient documentation

## 2018-04-24 MED ORDER — SODIUM IODIDE I-123 7.4 MBQ CAPS
411.0000 | ORAL_CAPSULE | Freq: Once | ORAL | Status: AC
  Administered 2018-04-24: 411 via ORAL

## 2018-04-25 ENCOUNTER — Encounter (HOSPITAL_COMMUNITY)
Admission: RE | Admit: 2018-04-25 | Discharge: 2018-04-25 | Disposition: A | Discharge status: Home / Self Care | Payer: Medicaid Other | Source: Ambulatory Visit | Attending: "Endocrinology | Admitting: "Endocrinology

## 2018-04-25 DIAGNOSIS — E0590 Thyrotoxicosis, unspecified without thyrotoxic crisis or storm: Secondary | ICD-10-CM | POA: Diagnosis not present

## 2018-05-02 ENCOUNTER — Encounter: Payer: Self-pay | Admitting: "Endocrinology

## 2018-05-02 ENCOUNTER — Ambulatory Visit (INDEPENDENT_AMBULATORY_CARE_PROVIDER_SITE_OTHER): Payer: Medicaid Other | Admitting: "Endocrinology

## 2018-05-02 VITALS — BP 143/94 | HR 91 | Ht 69.0 in | Wt 225.0 lb

## 2018-05-02 DIAGNOSIS — E0500 Thyrotoxicosis with diffuse goiter without thyrotoxic crisis or storm: Secondary | ICD-10-CM

## 2018-05-02 DIAGNOSIS — E0590 Thyrotoxicosis, unspecified without thyrotoxic crisis or storm: Secondary | ICD-10-CM | POA: Diagnosis not present

## 2018-05-02 NOTE — Progress Notes (Signed)
Endocrinology follow-up note                                            05/02/2018, 1:55 PM   Subjective:    Patient ID: Cheyenne Campbell, female    DOB: 1970/11/21, PCP Cheyenne Bodo, PA-C   Past Medical History:  Diagnosis Date  . Thyroid disease    History reviewed. No pertinent surgical history. Social History   Socioeconomic History  . Marital status: Single    Spouse name: Not on file  . Number of children: Not on file  . Years of education: Not on file  . Highest education level: Not on file  Occupational History  . Not on file  Social Needs  . Financial resource strain: Not on file  . Food insecurity:    Worry: Not on file    Inability: Not on file  . Transportation needs:    Medical: Not on file    Non-medical: Not on file  Tobacco Use  . Smoking status: Never Smoker  . Smokeless tobacco: Never Used  Substance and Sexual Activity  . Alcohol use: No  . Drug use: No  . Sexual activity: Not on file  Lifestyle  . Physical activity:    Days per week: Not on file    Minutes per session: Not on file  . Stress: Not on file  Relationships  . Social connections:    Talks on phone: Not on file    Gets together: Not on file    Attends religious service: Not on file    Active member of club or organization: Not on file    Attends meetings of clubs or organizations: Not on file    Relationship status: Not on file  Other Topics Concern  . Not on file  Social History Narrative  . Not on file   Outpatient Encounter Medications as of 05/02/2018  Medication Sig  . amLODipine (NORVASC) 5 MG tablet TAKE 1 TABLET BY MOUTH EVERY DAY  . [DISCONTINUED] omeprazole (PRILOSEC) 20 MG capsule Take 1 capsule (20 mg total) by mouth daily. (Patient not taking: Reported on 11/03/2017)   No facility-administered encounter medications on file as of 05/02/2018.    ALLERGIES: No Known Allergies  VACCINATION STATUS: Immunization History  Administered Date(s) Administered   . Influenza Split 11/01/2012  . Tdap 09/29/2017    HPI Cheyenne Campbell is 48 y.o. female who returns with repeat thyroid function tests.  She was previously treated with methimazole for hyperthyroidism from Graves' disease with clinical response. She was taken off of methimazole during her last visit due to the fact that she took this medication for more than 10 years and her presentation thyroid function tests were favorable. -Her previsit thyroid uptake and scan is still consistent with primary hyperthyroidism from Graves' disease with uniform uptake at 57.7%.  In 2009 she did have thyroid uptake and scan showing 73% uptake in 24 hours consistent with Graves' disease as well.  -She has no new complaints today, but continues to have tremors, palpitations, and sleep disturbance. -She has history of goiter. She has unidentified thyroid dysfunction in 1 of her sisters.  Review of Systems  Constitutional: +  steady Weight loss, + fatigue, + subjective hyperthermia Eyes: no blurry vision, no xerophthalmia ENT: no sore throat, + enlarged thyroid,  no dysphagia/odynophagia, no hoarseness Cardiovascular:  no Chest Pain, no Shortness of Breath, no palpitations, no leg swelling Musculoskeletal: no muscle/joint aches Skin: no rashes Neurological: no tremors, no numbness, no tingling, no dizziness Psychiatric: no depression, no anxiety  Objective:    BP (!) 143/94   Pulse 91   Ht 5\' 9"  (1.753 m)   Wt 225 lb (102.1 kg)   BMI 33.23 kg/m   Wt Readings from Last 3 Encounters:  05/02/18 225 lb (102.1 kg)  02/14/18 223 lb (101.2 kg)  11/03/17 238 lb (108 kg)    Physical Exam  Constitutional: + Obese, not in acute distress, normal state of mind Eyes: PERRLA, EOMI, no exophthalmos ENT: moist mucous membranes, + palpable thyromegaly , no cervical lymphadenopathy  Musculoskeletal: no gross deformities, strength intact in all four extremities Skin: moist, warm, no rashes Neurological: + tremor  with outstretched hands, deep tendon reflexes are normal in bilateral lower extremities.    CMP ( most recent) CMP     Component Value Date/Time   NA 139 09/29/2017 0841   K 3.9 09/29/2017 0841   CL 101 09/29/2017 0841   CO2 31 09/29/2017 0841   GLUCOSE 85 09/29/2017 0841   BUN 8 09/29/2017 0841   CREATININE 0.78 09/29/2017 0841   CALCIUM 9.5 09/29/2017 0841   PROT 8.3 (H) 09/29/2017 0841   AST 17 09/29/2017 0841   ALT 22 09/29/2017 0841   BILITOT 0.6 09/29/2017 0841   GFRNONAA 90 09/29/2017 0841   GFRAA 104 09/29/2017 0841     April 25, 2018 thyroid uptake and scan: 57.7% uniform uptake in 24 hours consistent with Graves' disease.   Results for Cheyenne Campbell (MRN [id number]) as of 05/02/2018 13:45  Ref. Range 01/06/2018 15:52  TSH Latest Units: mIU/L 0.01 (L)  T4,Free(Direct) Latest Ref Range: 0.8 - 1.8 ng/dL 2.3 (H)     Assessment & Plan:   1. Hx of Hyperthyroidism from Graves' disease -Her previsit thyroid function tests are consistent with relapse of hyperthyroidism, confirmed by repeat thyroid uptake and scan. -She is approached for definitive treatment with I-131 thyroid ablation and she agrees. -She understands the subsequent need for lifelong thyroid hormone replacement.  This treatment will be scheduled to be administered as soon as possible in [facility]. - She will return in 9 weeks with repeat thyroid function test for reassessment.  - I advised patient to maintain close follow up with Cheyenne Bodo, PA-C for primary care needs.  Follow up plan: Return in about 9 weeks (around 07/04/2018) for Follow up with Labs after I131 Therapy.   Cheyenne Lunch, MD [facility] [address] [place], [place] [address] Phone: [phone]  Fax: [phone]     05/02/2018, 1:55 PM  This note was partially dictated with voice recognition software. Similar sounding words can be transcribed inadequately or  may not  be corrected upon review.

## 2018-06-02 ENCOUNTER — Encounter (HOSPITAL_COMMUNITY): Payer: Self-pay

## 2018-06-02 ENCOUNTER — Ambulatory Visit (HOSPITAL_COMMUNITY): Payer: Medicaid Other

## 2018-06-30 DIAGNOSIS — E0590 Thyrotoxicosis, unspecified without thyrotoxic crisis or storm: Secondary | ICD-10-CM | POA: Diagnosis not present

## 2018-06-30 DIAGNOSIS — E0500 Thyrotoxicosis with diffuse goiter without thyrotoxic crisis or storm: Secondary | ICD-10-CM | POA: Diagnosis not present

## 2018-07-01 LAB — TSH: TSH: 0.02 m[IU]/L — ABNORMAL LOW

## 2018-07-01 LAB — T4, FREE: Free T4: 1.4 ng/dL (ref 0.8–1.8)

## 2018-07-03 DIAGNOSIS — H5213 Myopia, bilateral: Secondary | ICD-10-CM | POA: Diagnosis not present

## 2018-07-04 ENCOUNTER — Ambulatory Visit: Payer: Medicaid Other | Admitting: "Endocrinology

## 2018-07-04 DIAGNOSIS — H5213 Myopia, bilateral: Secondary | ICD-10-CM | POA: Diagnosis not present

## 2018-07-05 ENCOUNTER — Other Ambulatory Visit: Payer: Self-pay | Admitting: "Endocrinology

## 2018-07-05 DIAGNOSIS — E0590 Thyrotoxicosis, unspecified without thyrotoxic crisis or storm: Secondary | ICD-10-CM

## 2018-07-14 ENCOUNTER — Encounter (HOSPITAL_COMMUNITY)
Admission: RE | Admit: 2018-07-14 | Discharge: 2018-07-14 | Disposition: A | Discharge status: Home / Self Care | Payer: Medicaid Other | Source: Ambulatory Visit | Attending: "Endocrinology | Admitting: "Endocrinology

## 2018-07-14 ENCOUNTER — Other Ambulatory Visit: Payer: Self-pay

## 2018-07-14 DIAGNOSIS — E0590 Thyrotoxicosis, unspecified without thyrotoxic crisis or storm: Secondary | ICD-10-CM | POA: Insufficient documentation

## 2018-07-14 DIAGNOSIS — E0500 Thyrotoxicosis with diffuse goiter without thyrotoxic crisis or storm: Secondary | ICD-10-CM | POA: Insufficient documentation

## 2018-07-14 LAB — HCG, SERUM, QUALITATIVE: Preg, Serum: NEGATIVE

## 2018-07-14 MED ORDER — SODIUM IODIDE I 131 CAPSULE
15.6500 | Freq: Once | INTRAVENOUS | Status: AC | PRN
  Administered 2018-07-14: 14:00:00 15.65 via ORAL

## 2018-07-24 DIAGNOSIS — H524 Presbyopia: Secondary | ICD-10-CM | POA: Diagnosis not present

## 2018-08-11 DIAGNOSIS — H53413 Scotoma involving central area, bilateral: Secondary | ICD-10-CM | POA: Diagnosis not present

## 2018-08-11 DIAGNOSIS — H40023 Open angle with borderline findings, high risk, bilateral: Secondary | ICD-10-CM | POA: Diagnosis not present

## 2018-08-18 DIAGNOSIS — E0590 Thyrotoxicosis, unspecified without thyrotoxic crisis or storm: Secondary | ICD-10-CM | POA: Diagnosis not present

## 2018-08-19 LAB — TSH: TSH: 0.01 mIU/L — ABNORMAL LOW

## 2018-08-19 LAB — T4, FREE: Free T4: 2 ng/dL — ABNORMAL HIGH (ref 0.8–1.8)

## 2018-08-20 ENCOUNTER — Other Ambulatory Visit: Payer: Self-pay | Admitting: Family Medicine

## 2018-08-20 DIAGNOSIS — I10 Essential (primary) hypertension: Secondary | ICD-10-CM

## 2018-08-22 ENCOUNTER — Encounter: Payer: Self-pay | Admitting: "Endocrinology

## 2018-08-22 ENCOUNTER — Other Ambulatory Visit: Payer: Self-pay

## 2018-08-22 ENCOUNTER — Ambulatory Visit (INDEPENDENT_AMBULATORY_CARE_PROVIDER_SITE_OTHER): Payer: Medicaid Other | Admitting: "Endocrinology

## 2018-08-22 VITALS — BP 125/80 | HR 78 | Ht 69.0 in | Wt 231.0 lb

## 2018-08-22 DIAGNOSIS — E0590 Thyrotoxicosis, unspecified without thyrotoxic crisis or storm: Secondary | ICD-10-CM | POA: Diagnosis not present

## 2018-08-22 DIAGNOSIS — E0500 Thyrotoxicosis with diffuse goiter without thyrotoxic crisis or storm: Secondary | ICD-10-CM

## 2018-08-22 NOTE — Progress Notes (Signed)
Endocrinology follow-up note                                            08/22/2018, 12:40 PM   Subjective:    Patient ID: Cheyenne JarvisYvette Cousineau, female    DOB: Jan 20, 1971, PCP Dorena Bodoixon, Mary B, PA-C   Past Medical History:  Diagnosis Date  . Thyroid disease    History reviewed. No pertinent surgical history. Social History   Socioeconomic History  . Marital status: Single    Spouse name: Not on file  . Number of children: Not on file  . Years of education: Not on file  . Highest education level: Not on file  Occupational History  . Not on file  Social Needs  . Financial resource strain: Not on file  . Food insecurity:    Worry: Not on file    Inability: Not on file  . Transportation needs:    Medical: Not on file    Non-medical: Not on file  Tobacco Use  . Smoking status: Never Smoker  . Smokeless tobacco: Never Used  Substance and Sexual Activity  . Alcohol use: No  . Drug use: No  . Sexual activity: Not on file  Lifestyle  . Physical activity:    Days per week: Not on file    Minutes per session: Not on file  . Stress: Not on file  Relationships  . Social connections:    Talks on phone: Not on file    Gets together: Not on file    Attends religious service: Not on file    Active member of club or organization: Not on file    Attends meetings of clubs or organizations: Not on file    Relationship status: Not on file  Other Topics Concern  . Not on file  Social History Narrative  . Not on file   Outpatient Encounter Medications as of 08/22/2018  Medication Sig  . Cyanocobalamin (VITAMIN B-12) 1000 MCG/15ML LIQD Take by mouth.  Marland Kitchen. VITAMIN D PO Take by mouth daily.  Marland Kitchen. amLODipine (NORVASC) 5 MG tablet TAKE 1 TABLET BY MOUTH EVERY DAY   No facility-administered encounter medications on file as of 08/22/2018.    ALLERGIES: No Known Allergies  VACCINATION STATUS: Immunization History  Administered Date(s) Administered  . Influenza Split 11/01/2012  . Tdap  09/29/2017    HPI Cheyenne JarvisYvette Madkins is 48 y.o. female who returns with repeat thyroid function tests.  She was previously treated with methimazole for hyperthyroidism from Graves' disease with clinical response.  Due to relapse in her hyperthyroidism, she was given radioactive iodine thyroid ablation on Jul 14, 2018, her labs were done on August 18, 2018.  She has not developed hypothyroidism yet.  She is reporting improvement in her previous symptoms, denies palpitations, tremors, heat intolerance.   She has gained 6 pounds since last visit, and she reports she is feeling better.  -She has no new complaints today.  -She has history of goiter. She has unidentified thyroid dysfunction in 1 of her sisters.  Review of Systems  Constitutional: + Weight gain,+ fatigue, + subjective hyperthermia Eyes: no blurry vision, no xerophthalmia ENT: no sore throat, + enlarged thyroid,  no dysphagia/odynophagia, no hoarseness Cardiovascular: no Chest Pain, no Shortness of Breath, no palpitations, no leg swelling Musculoskeletal: no muscle/joint aches Skin: no rashes Neurological: no tremors, no numbness,  no tingling, no dizziness Psychiatric: no depression, no anxiety  Objective:    BP 125/80   Pulse 78   Ht 5\' 9"  (1.753 m)   Wt 231 lb (104.8 kg)   BMI 34.11 kg/m   Wt Readings from Last 3 Encounters:  08/22/18 231 lb (104.8 kg)  05/02/18 225 lb (102.1 kg)  02/14/18 223 lb (101.2 kg)    Physical Exam  Constitutional: + Obese, not in acute distress, normal state of mind Eyes: PERRLA, EOMI, no exophthalmos ENT: moist mucous membranes, + palpable thyromegaly , no cervical lymphadenopathy  Musculoskeletal: no gross deformities, strength intact in all four extremities Skin: moist, warm, no rashes Neurological: + tremor with outstretched hands, deep tendon reflexes are normal in bilateral lower extremities.    CMP ( most recent) CMP     Component Value Date/Time   NA 139 09/29/2017 0841   K 3.9  09/29/2017 0841   CL 101 09/29/2017 0841   CO2 31 09/29/2017 0841   GLUCOSE 85 09/29/2017 0841   BUN 8 09/29/2017 0841   CREATININE 0.78 09/29/2017 0841   CALCIUM 9.5 09/29/2017 0841   PROT 8.3 (H) 09/29/2017 0841   AST 17 09/29/2017 0841   ALT 22 09/29/2017 0841   BILITOT 0.6 09/29/2017 0841   GFRNONAA 90 09/29/2017 0841   GFRAA 104 09/29/2017 0841     April 25, 2018 thyroid uptake and scan: 57.7% uniform uptake in 24 hours consistent with Graves' disease.  Recent Results (from the past 2160 hour(s))  TSH     Status: Abnormal   Collection Time: 06/30/18 10:39 AM  Result Value Ref Range   TSH 0.02 (L) mIU/L    Comment:           Reference Range .           > or = 20 Years  0.40-4.50 .                Pregnancy Ranges           First trimester    0.26-2.66           Second trimester   0.55-2.73           Third trimester    0.43-2.91   T4, free     Status: None   Collection Time: 06/30/18 10:39 AM  Result Value Ref Range   Free T4 1.4 0.8 - 1.8 ng/dL  hCG, serum, qualitative     Status: None   Collection Time: 07/14/18 12:35 PM  Result Value Ref Range   Preg, Serum NEGATIVE NEGATIVE    Comment:        THE SENSITIVITY OF THIS METHODOLOGY IS >10 mIU/mL. Performed at [facility], [address] [place], [place] [address]   TSH     Status: Abnormal   Collection Time: 08/18/18 12:27 PM  Result Value Ref Range   TSH 0.01 (L) mIU/L    Comment:           Reference Range .           > or = 20 Years  0.40-4.50 .                Pregnancy Ranges           First trimester    0.26-2.66           Second trimester   0.55-2.73           Third trimester  0.43-2.91   T4, Free     Status: Abnormal   Collection Time: 08/18/18 12:27 PM  Result Value Ref Range   Free T4 2.0 (H) 0.8 - 1.8 ng/dL     Assessment & Plan:   1. Hx of Hyperthyroidism from Graves' disease -She is status post I-131 thyroid ablation for hyperthyroidism from Graves' disease.  Her previsit  labs show that she is not hypothyroid yet.  She would not be initiated on thyroid hormone replacement for now.  She will return in 9 weeks with repeat thyroid function test.    - I advised patient to maintain close follow up with Orlena Sheldon, PA-C for primary care needs.  Time for this visit 15 minutes.  Follow up plan: Return in about 9 weeks (around 10/24/2018) for Follow up with Pre-visit Labs.   Glade Lloyd, MD [facility] [address] [place], [place] [address] Phone: [phone]  Fax: [phone]     08/22/2018, 12:40 PM  This note was partially dictated with voice recognition software. Similar sounding words can be transcribed inadequately or may not  be corrected upon review.

## 2018-10-18 DIAGNOSIS — E0500 Thyrotoxicosis with diffuse goiter without thyrotoxic crisis or storm: Secondary | ICD-10-CM | POA: Diagnosis not present

## 2018-10-19 LAB — T4, FREE: Free T4: 0.5 ng/dL — ABNORMAL LOW (ref 0.8–1.8)

## 2018-10-19 LAB — TSH: TSH: 32.51 mIU/L — ABNORMAL HIGH

## 2018-10-20 ENCOUNTER — Other Ambulatory Visit: Payer: Self-pay | Admitting: "Endocrinology

## 2018-10-20 MED ORDER — LEVOTHYROXINE SODIUM 100 MCG PO TABS: 100.0000 ug | ORAL_TABLET | Freq: Every day | ORAL | 1 refills | Status: DC

## 2018-10-23 ENCOUNTER — Telehealth: Payer: Self-pay

## 2018-10-23 NOTE — Telephone Encounter (Signed)
-----   Message from Cassandria Anger, MD sent at 10/20/2018  9:16 AM EDT ----- Maudie Mercury, I am sending a rx for Levothyroxine to her pharmacy for her to start even before her appointment. And she has to keep her appointment.

## 2018-10-23 NOTE — Telephone Encounter (Signed)
Pt notified to pick this up from pharmacy

## 2018-10-24 ENCOUNTER — Ambulatory Visit (INDEPENDENT_AMBULATORY_CARE_PROVIDER_SITE_OTHER): Payer: Medicaid Other | Admitting: "Endocrinology

## 2018-10-24 ENCOUNTER — Other Ambulatory Visit: Payer: Self-pay

## 2018-10-24 ENCOUNTER — Encounter: Payer: Self-pay | Admitting: "Endocrinology

## 2018-10-24 DIAGNOSIS — E890 Postprocedural hypothyroidism: Secondary | ICD-10-CM

## 2018-10-24 MED ORDER — LEVOTHYROXINE SODIUM 125 MCG PO TABS: 125.0000 ug | ORAL_TABLET | Freq: Every day | ORAL | 2 refills | Status: DC

## 2018-10-24 NOTE — Progress Notes (Signed)
10/24/2018, 2:07 PM                                Endocrinology Telehealth Visit Follow up Note -During COVID -19 Pandemic  I connected with Cheyenne Campbell on 10/24/2018   by telephone and verified that I am speaking with the correct person using two identifiers. [employer], 04/04/1970. she has verbally consented to this visit. All issues noted in this document were discussed and addressed. The format was not optimal for physical exam.   Subjective:    Patient ID: Cheyenne Campbell, female    DOB: [birth year], PCP Orlena Sheldon, PA-C   Past Medical History:  Diagnosis Date  . Thyroid disease    History reviewed. No pertinent surgical history. Social History   Socioeconomic History  . Marital status: Single    Spouse name: Not on file  . Number of children: Not on file  . Years of education: Not on file  . Highest education level: Not on file  Occupational History  . Not on file  Social Needs  . Financial resource strain: Not on file  . Food insecurity    Worry: Not on file    Inability: Not on file  . Transportation needs    Medical: Not on file    Non-medical: Not on file  Tobacco Use  . Smoking status: Never Smoker  . Smokeless tobacco: Never Used  Substance and Sexual Activity  . Alcohol use: No  . Drug use: No  . Sexual activity: Not on file  Lifestyle  . Physical activity    Days per week: Not on file    Minutes per session: Not on file  . Stress: Not on file  Relationships  . Social Herbalist on phone: Not on file    Gets together: Not on file    Attends religious service: Not on file    Active member of club or organization: Not on file    Attends meetings of clubs or organizations: Not on file    Relationship status: Not on file  Other Topics Concern  . Not on file  Social History Narrative  . Not on file   Outpatient Encounter Medications as of 10/24/2018  Medication Sig  . amLODipine  (NORVASC) 5 MG tablet TAKE 1 TABLET BY MOUTH EVERY DAY  . Cyanocobalamin (VITAMIN B-12) 1000 MCG/15ML LIQD Take by mouth.  . levothyroxine (SYNTHROID) 125 MCG tablet Take 1 tablet (125 mcg total) by mouth daily before breakfast.  . VITAMIN D PO Take by mouth daily.  . [DISCONTINUED] levothyroxine (SYNTHROID) 100 MCG tablet Take 1 tablet (100 mcg total) by mouth daily before breakfast.   No facility-administered encounter medications on file as of 10/24/2018.    ALLERGIES: No Known Allergies  VACCINATION STATUS: Immunization History  Administered Date(s) Administered  . Influenza Split 11/01/2012  . Tdap 09/29/2017    HPI Cheyenne Campbell is 48 y.o. female who is being engaged in telehealth for follow-up of hypothyroidism.  She was given I-131 thyroid ablation on Jul 14, 2018.  Her subsequent labs showed that she has developed hypothyroidism.  A prescription for levothyroxine  was called in last week, however she did not pick up.    She has no new complaints today.  She has gained 6 pounds since her treatment  -She has history of goiter. She has unidentified thyroid dysfunction in 1 of her sisters.  Review of Systems  Limited as above.  Objective:    There were no vitals taken for this visit.  Wt Readings from Last 3 Encounters:  08/22/18 231 lb (104.8 kg)  05/02/18 225 lb (102.1 kg)  02/14/18 223 lb (101.2 kg)    Physical Exam   CMP ( most recent) CMP     Component Value Date/Time   NA 139 09/29/2017 0841   K 3.9 09/29/2017 0841   CL 101 09/29/2017 0841   CO2 31 09/29/2017 0841   GLUCOSE 85 09/29/2017 0841   BUN 8 09/29/2017 0841   CREATININE 0.78 09/29/2017 0841   CALCIUM 9.5 09/29/2017 0841   PROT 8.3 (H) 09/29/2017 0841   AST 17 09/29/2017 0841   ALT 22 09/29/2017 0841   BILITOT 0.6 09/29/2017 0841   GFRNONAA 90 09/29/2017 0841   GFRAA 104 09/29/2017 0841     April 25, 2018 thyroid uptake and scan: 57.7% uniform uptake in 24 hours consistent with Graves'  disease.  Recent Results (from the past 2160 hour(s))  TSH     Status: Abnormal   Collection Time: 08/18/18 12:27 PM  Result Value Ref Range   TSH 0.01 (L) mIU/L    Comment:           Reference Range .           > or = 20 Years  0.40-4.50 .                Pregnancy Ranges           First trimester    0.26-2.66           Second trimester   0.55-2.73           Third trimester    0.43-2.91   T4, Free     Status: Abnormal   Collection Time: 08/18/18 12:27 PM  Result Value Ref Range   Free T4 2.0 (H) 0.8 - 1.8 ng/dL  TSH     Status: Abnormal   Collection Time: 10/18/18  1:29 PM  Result Value Ref Range   TSH 32.51 (H) mIU/L    Comment:           Reference Range .           > or = 20 Years  0.40-4.50 .                Pregnancy Ranges           First trimester    0.26-2.66           Second trimester   0.55-2.73           Third trimester    0.43-2.91   T4, free     Status: Abnormal   Collection Time: 10/18/18  1:29 PM  Result Value Ref Range   Free T4 0.5 (L) 0.8 - 1.8 ng/dL     Assessment & Plan:   1.  RAI induced hypothyroidism  2.  Graves' disease-history of  Hx of Hyperthyroidism from Graves' disease She is status post I-131 thyroid ablation for Graves' disease on Jul 14, 2018. Her most recent thyroid function tests are consistent with treatment effect and hypothyroidism. She is approximately a prescription  for levothyroxine.  I discussed and initiated levothyroxine 125 mcg p.o. daily before breakfast.  - We discussed about the correct intake of her thyroid hormone, on empty stomach at fasting, with water, separated by at least 30 minutes from breakfast and other medications,  and separated by more than 4 hours from calcium, iron, multivitamins, acid reflux medications (PPIs). -Patient is made aware of the fact that thyroid hormone replacement is needed for life, dose to be adjusted by periodic monitoring of thyroid function tests.  - I advised patient to maintain close  follow up with Dorena Bodoixon, Mary B, PA-C for primary care needs.   Time for this visit: 15 minutes. Cheyenne Campbell  participated in the discussions, expressed understanding, and voiced agreement with the above plans.  All questions were answered to her satisfaction. she is encouraged to contact clinic should she have any questions or concerns prior to her return visit.   Follow up plan: Return in about 3 months (around 01/24/2019) for Follow up with Pre-visit Labs.   Cheyenne LunchGebre Finlay Mills, MD [facility] [address] [place], [place] [address] Phone: [phone]  Fax: [phone]     10/24/2018, 2:07 PM  This note was partially dictated with voice recognition software. Similar sounding words can be transcribed inadequately or may not  be corrected upon review.

## 2019-01-15 ENCOUNTER — Other Ambulatory Visit: Payer: Self-pay | Admitting: "Endocrinology

## 2019-01-25 DIAGNOSIS — E890 Postprocedural hypothyroidism: Secondary | ICD-10-CM | POA: Diagnosis not present

## 2019-01-25 LAB — T4, FREE: Free T4: 1.4 ng/dL (ref 0.8–1.8)

## 2019-01-25 LAB — TSH: TSH: 0.82 mIU/L

## 2019-01-30 ENCOUNTER — Ambulatory Visit (INDEPENDENT_AMBULATORY_CARE_PROVIDER_SITE_OTHER): Payer: Medicaid Other | Admitting: "Endocrinology

## 2019-01-30 ENCOUNTER — Other Ambulatory Visit: Payer: Self-pay

## 2019-01-30 ENCOUNTER — Encounter: Payer: Self-pay | Admitting: "Endocrinology

## 2019-01-30 DIAGNOSIS — E890 Postprocedural hypothyroidism: Secondary | ICD-10-CM | POA: Diagnosis not present

## 2019-01-30 MED ORDER — LEVOTHYROXINE SODIUM 125 MCG PO TABS: 125.0000 ug | ORAL_TABLET | Freq: Every day | ORAL | 1 refills | Status: DC

## 2019-01-30 NOTE — Progress Notes (Signed)
01/30/2019, 11:22 AM                                Endocrinology Telehealth Visit Follow up Note -During COVID -19 Pandemic  I connected with Cheyenne Campbell on 01/30/2019   by telephone and verified that I am speaking with the correct person using two identifiers. FedExYvette Kaminski, 03/05/1971. she has verbally consented to this visit. All issues noted in this document were discussed and addressed. The format was not optimal for physical exam.   Subjective:    Patient ID: Cheyenne JarvisYvette Campbell, female    DOB: 03/05/1971, PCP Dorena Bodoixon, Mary B, PA-C   Past Medical History:  Diagnosis Date  . Thyroid disease    History reviewed. No pertinent surgical history. Social History   Socioeconomic History  . Marital status: Single    Spouse name: Not on file  . Number of children: Not on file  . Years of education: Not on file  . Highest education level: Not on file  Occupational History  . Not on file  Social Needs  . Financial resource strain: Not on file  . Food insecurity    Worry: Not on file    Inability: Not on file  . Transportation needs    Medical: Not on file    Non-medical: Not on file  Tobacco Use  . Smoking status: Never Smoker  . Smokeless tobacco: Never Used  Substance and Sexual Activity  . Alcohol use: No  . Drug use: No  . Sexual activity: Not on file  Lifestyle  . Physical activity    Days per week: Not on file    Minutes per session: Not on file  . Stress: Not on file  Relationships  . Social Musicianconnections    Talks on phone: Not on file    Gets together: Not on file    Attends religious service: Not on file    Active member of club or organization: Not on file    Attends meetings of clubs or organizations: Not on file    Relationship status: Not on file  Other Topics Concern  . Not on file  Social History Narrative  . Not on file   Outpatient Encounter Medications as of 01/30/2019  Medication Sig  .  amLODipine (NORVASC) 5 MG tablet TAKE 1 TABLET BY MOUTH EVERY DAY  . Cyanocobalamin (VITAMIN B-12) 1000 MCG/15ML LIQD Take by mouth.  . levothyroxine (SYNTHROID) 125 MCG tablet Take 1 tablet (125 mcg total) by mouth daily before breakfast.  . VITAMIN D PO Take by mouth daily.  . [DISCONTINUED] levothyroxine (SYNTHROID) 125 MCG tablet TAKE 1 TABLET (125 MCG TOTAL) BY MOUTH DAILY BEFORE BREAKFAST.   No facility-administered encounter medications on file as of 01/30/2019.    ALLERGIES: No Known Allergies  VACCINATION STATUS: Immunization History  Administered Date(s) Administered  . Influenza Split 11/01/2012  . Tdap 09/29/2017    HPI Cheyenne Campbell is 48 y.o. female who is being engaged in telehealth for follow-up of hypothyroidism.  She was given I-131 thyroid ablation on Jul 14, 2018.  Her subsequent labs showed that she has developed hypothyroidism.  She is currently on  levothyroxine 125 mcg p.o. daily before breakfast.  She reports compliance.  She feels better.   She has no new complaints today. -She has history of goiter. She has unidentified thyroid dysfunction in 1 of her sisters.  Review of Systems  Limited as above.  Objective:    There were no vitals taken for this visit.  Wt Readings from Last 3 Encounters:  08/22/18 231 lb (104.8 kg)  05/02/18 225 lb (102.1 kg)  02/14/18 223 lb (101.2 kg)    Physical Exam   CMP ( most recent) CMP     Component Value Date/Time   NA 139 09/29/2017 0841   K 3.9 09/29/2017 0841   CL 101 09/29/2017 0841   CO2 31 09/29/2017 0841   GLUCOSE 85 09/29/2017 0841   BUN 8 09/29/2017 0841   CREATININE 0.78 09/29/2017 0841   CALCIUM 9.5 09/29/2017 0841   PROT 8.3 (H) 09/29/2017 0841   AST 17 09/29/2017 0841   ALT 22 09/29/2017 0841   BILITOT 0.6 09/29/2017 0841   GFRNONAA 90 09/29/2017 0841   GFRAA 104 09/29/2017 0841     April 25, 2018 thyroid uptake and scan: 57.7% uniform uptake in 24 hours consistent with Graves'  disease.  Recent Results (from the past 2160 hour(s))  T4, free     Status: None   Collection Time: 01/25/19 10:07 AM  Result Value Ref Range   Free T4 1.4 0.8 - 1.8 ng/dL  TSH     Status: None   Collection Time: 01/25/19 10:07 AM  Result Value Ref Range   TSH 0.82 mIU/L    Comment:           Reference Range .           > or = 20 Years  0.40-4.50 .                Pregnancy Ranges           First trimester    0.26-2.66           Second trimester   0.55-2.73           Third trimester    0.43-2.91      Assessment & Plan:   1.  RAI induced hypothyroidism  2.  Graves' disease-history of   She is status post I-131 thyroid ablation for Graves' disease on Jul 14, 2018. Her most recent thyroid function tests are consistent with appropriate replacement with levothyroxine.  She is advised to continue levothyroxine 125 mcg p.o. daily before breakfast.    - We discussed about the correct intake of her thyroid hormone, on empty stomach at fasting, with water, separated by at least 30 minutes from breakfast and other medications,  and separated by more than 4 hours from calcium, iron, multivitamins, acid reflux medications (PPIs). -Patient is made aware of the fact that thyroid hormone replacement is needed for life, dose to be adjusted by periodic monitoring of thyroid function tests.  - I advised patient to maintain close follow up with Dorena Bodo, PA-C for primary care needs.  Time for this visit: 15 minutes. Viney Dona  participated in the discussions, expressed understanding, and voiced agreement with the above plans.  All questions were answered to her satisfaction. she is encouraged to contact clinic should she have any questions or concerns prior to her return visit.   Follow up plan: Return in about 6 months (around 07/30/2019) for Follow up with Pre-visit Labs.   Marquis Lunch, MD  [facility] [address] [place], [place] [address] Phone: [phone]  Fax: [phone]     01/30/2019, 11:22 AM  This note was partially dictated with voice recognition software. Similar sounding words can be transcribed inadequately or may not  be corrected upon review.

## 2019-02-15 ENCOUNTER — Other Ambulatory Visit: Payer: Self-pay | Admitting: Family Medicine

## 2019-02-15 DIAGNOSIS — I10 Essential (primary) hypertension: Secondary | ICD-10-CM

## 2019-03-22 DIAGNOSIS — H53413 Scotoma involving central area, bilateral: Secondary | ICD-10-CM | POA: Diagnosis not present

## 2019-03-22 DIAGNOSIS — H35413 Lattice degeneration of retina, bilateral: Secondary | ICD-10-CM | POA: Diagnosis not present

## 2019-03-22 DIAGNOSIS — H40023 Open angle with borderline findings, high risk, bilateral: Secondary | ICD-10-CM | POA: Diagnosis not present

## 2019-04-09 ENCOUNTER — Other Ambulatory Visit: Payer: Self-pay | Admitting: Family Medicine

## 2019-04-09 DIAGNOSIS — Z1231 Encounter for screening mammogram for malignant neoplasm of breast: Secondary | ICD-10-CM

## 2019-05-09 ENCOUNTER — Ambulatory Visit
Admission: RE | Admit: 2019-05-09 | Discharge: 2019-05-09 | Disposition: A | Discharge status: Home / Self Care | Payer: Medicaid Other | Source: Ambulatory Visit | Attending: Family Medicine | Admitting: Family Medicine

## 2019-05-09 ENCOUNTER — Other Ambulatory Visit: Payer: Self-pay

## 2019-05-09 ENCOUNTER — Ambulatory Visit: Payer: Medicaid Other

## 2019-05-09 DIAGNOSIS — Z1231 Encounter for screening mammogram for malignant neoplasm of breast: Secondary | ICD-10-CM | POA: Diagnosis not present

## 2019-07-24 DIAGNOSIS — E890 Postprocedural hypothyroidism: Secondary | ICD-10-CM | POA: Diagnosis not present

## 2019-07-25 LAB — T4, FREE: Free T4: 1.8 ng/dL (ref 0.8–1.8)

## 2019-07-25 LAB — TSH: TSH: 0.03 mIU/L — ABNORMAL LOW

## 2019-08-01 ENCOUNTER — Ambulatory Visit (INDEPENDENT_AMBULATORY_CARE_PROVIDER_SITE_OTHER): Payer: Medicaid Other | Admitting: "Endocrinology

## 2019-08-01 ENCOUNTER — Encounter: Payer: Self-pay | Admitting: "Endocrinology

## 2019-08-01 ENCOUNTER — Other Ambulatory Visit: Payer: Self-pay

## 2019-08-01 VITALS — BP 153/97 | HR 82 | Ht 69.0 in | Wt 245.4 lb

## 2019-08-01 DIAGNOSIS — I10 Essential (primary) hypertension: Secondary | ICD-10-CM

## 2019-08-01 DIAGNOSIS — E890 Postprocedural hypothyroidism: Secondary | ICD-10-CM

## 2019-08-01 MED ORDER — LEVOTHYROXINE SODIUM 100 MCG PO TABS: 100.0000 ug | ORAL_TABLET | Freq: Every day | ORAL | 2 refills | Status: DC

## 2019-08-01 MED ORDER — AMLODIPINE BESYLATE 10 MG PO TABS: 5.0000 mg | ORAL_TABLET | Freq: Every day | ORAL | 3 refills | Status: DC

## 2019-08-01 NOTE — Progress Notes (Signed)
08/01/2019, 12:47 PM      Endocrinology follow-up note    Subjective:    Patient ID: Cheyenne Campbell, female    DOB: 1970-12-25, PCP Dorena Bodo, PA-C   Past Medical History:  Diagnosis Date  . Thyroid disease    History reviewed. No pertinent surgical history. Social History   Socioeconomic History  . Marital status: Single    Spouse name: Not on file  . Number of children: Not on file  . Years of education: Not on file  . Highest education level: Not on file  Occupational History  . Not on file  Tobacco Use  . Smoking status: Never Smoker  . Smokeless tobacco: Never Used  Substance and Sexual Activity  . Alcohol use: No  . Drug use: No  . Sexual activity: Not on file  Other Topics Concern  . Not on file  Social History Narrative  . Not on file   Social Determinants of Health   Financial Resource Strain:   . Difficulty of Paying Living Expenses:   Food Insecurity:   . Worried About Programme researcher, broadcasting/film/video in the Last Year:   . Barista in the Last Year:   Transportation Needs:   . Freight forwarder (Medical):   Marland Kitchen Lack of Transportation (Non-Medical):   Physical Activity:   . Days of Exercise per Week:   . Minutes of Exercise per Session:   Stress:   . Feeling of Stress :   Social Connections:   . Frequency of Communication with Friends and Family:   . Frequency of Social Gatherings with Friends and Family:   . Attends Religious Services:   . Active Member of Clubs or Organizations:   . Attends Banker Meetings:   Marland Kitchen Marital Status:    Outpatient Encounter Medications as of 08/01/2019  Medication Sig  . amLODipine (NORVASC) 5 MG tablet TAKE 1 TABLET BY MOUTH EVERY DAY  . Cyanocobalamin (VITAMIN B-12) 1000 MCG/15ML LIQD Take by mouth.  . levothyroxine (SYNTHROID) 100 MCG tablet Take 1 tablet (100 mcg total) by mouth daily before breakfast.  . VITAMIN D PO Take by mouth daily.   . [DISCONTINUED] levothyroxine (SYNTHROID) 125 MCG tablet Take 1 tablet (125 mcg total) by mouth daily before breakfast.   No facility-administered encounter medications on file as of 08/01/2019.   ALLERGIES: No Known Allergies  VACCINATION STATUS: Immunization History  Administered Date(s) Administered  . Influenza Split 11/01/2012  . Tdap 09/29/2017    HPI Cheyenne Campbell is 49 y.o. female who is being seen in follow-up for RAI induced hypothyroidism.    She was given I-131 thyroid ablation on Jul 14, 2018. She is currently on levothyroxine 125 mcg p.o. daily before breakfast.  She reports compliance.  She feels better.  Her previsit labs show evidence of over replacement.  She has no new complaints today. -She has history of goiter. She has unidentified thyroid dysfunction in 1 of her sisters.  Review of Systems  Limited as above.  Objective:    BP (!) 153/97   Pulse 82   Ht 5\' 9"  (1.753 m)   Wt 245 lb 6.4 oz (111.3 kg)   BMI 36.24 kg/m  Wt Readings from Last 3 Encounters:  08/01/19 245 lb 6.4 oz (111.3 kg)  08/22/18 231 lb (104.8 kg)  05/02/18 225 lb (102.1 kg)    Physical Exam   Physical Exam- Limited  Constitutional:  Body mass index is 36.24 kg/m. , not in acute distress, normal state of mind Eyes:  EOMI, no exophthalmos Neck: Supple Thyroid: No gross goiter Respiratory: Adequate breathing efforts Musculoskeletal: no gross deformities, strength intact in all four extremities, no gross restriction of joint movements Skin:  no rashes, no hyperemia Neurological: no tremor with outstretched hands,   CMP ( most recent) CMP     Component Value Date/Time   NA 139 09/29/2017 0841   K 3.9 09/29/2017 0841   CL 101 09/29/2017 0841   CO2 31 09/29/2017 0841   GLUCOSE 85 09/29/2017 0841   BUN 8 09/29/2017 0841   CREATININE 0.78 09/29/2017 0841   CALCIUM 9.5 09/29/2017 0841   PROT 8.3 (H) 09/29/2017 0841   AST 17 09/29/2017 0841   ALT 22 09/29/2017 0841    BILITOT 0.6 09/29/2017 0841   GFRNONAA 90 09/29/2017 0841   GFRAA 104 09/29/2017 0841     April 25, 2018 thyroid uptake and scan: 57.7% uniform uptake in 24 hours consistent with Graves' disease.  Recent Results (from the past 2160 hour(s))  TSH SOLSTAS     Status: Abnormal   Collection Time: 07/24/19  2:35 PM  Result Value Ref Range   TSH 0.03 (L) mIU/L    Comment:           Reference Range .           > or = 20 Years  0.40-4.50 .                Pregnancy Ranges           First trimester    0.26-2.66           Second trimester   0.55-2.73           Third trimester    0.43-2.91   T4, free SOLSTAS     Status: None   Collection Time: 07/24/19  2:35 PM  Result Value Ref Range   Free T4 1.8 0.8 - 1.8 ng/dL     Assessment & Plan:   1.  RAI induced hypothyroidism  2.  Graves' disease-history of   She is status post I-131 thyroid ablation for Graves' disease on Jul 14, 2018. Her most recent thyroid function tests are consistent with slight over replacement.  I discussed and lowered her levothyroxine to 100 mcg p.o. daily before breakfast.    - We discussed about the correct intake of her thyroid hormone, on empty stomach at fasting, with water, separated by at least 30 minutes from breakfast and other medications,  and separated by more than 4 hours from calcium, iron, multivitamins, acid reflux medications (PPIs). -Patient is made aware of the fact that thyroid hormone replacement is needed for life, dose to be adjusted by periodic monitoring of thyroid function tests.  -Her blood pressure is not controlled.  She is advised to increase Norvasc to 10 mg daily with breakfast, and see her PCP soon as possible.    - I advised patient to maintain close follow up with Orlena Sheldon, PA-C for primary care needs.     - Time spent on this patient care encounter:  20 minutes of which 50% was spent in  counseling and the rest reviewing  her current  and  previous labs / studies and  medications  doses and developing a plan for long term care. Keshanna Sahota  participated in the discussions, expressed understanding, and voiced agreement with the above plans.  All questions were answered to her satisfaction. she is encouraged to contact clinic should she have any questions or concerns prior to her return visit.   Follow up plan: Return in about 3 months (around 11/01/2019) for F/U with Pre-visit Labs.   Marquis Lunch, MD [facility] [address] [place], [place] [address] Phone: [phone]  Fax: [phone]     08/01/2019, 12:47 PM  This note was partially dictated with voice recognition software. Similar sounding words can be transcribed inadequately or may not  be corrected upon review.

## 2019-10-16 ENCOUNTER — Other Ambulatory Visit: Payer: Self-pay | Admitting: "Endocrinology

## 2019-10-31 DIAGNOSIS — E890 Postprocedural hypothyroidism: Secondary | ICD-10-CM | POA: Diagnosis not present

## 2019-10-31 LAB — T4, FREE: Free T4: 1.8 ng/dL (ref 0.8–1.8)

## 2019-10-31 LAB — TSH: TSH: 0.02 mIU/L — ABNORMAL LOW

## 2019-11-06 ENCOUNTER — Telehealth (INDEPENDENT_AMBULATORY_CARE_PROVIDER_SITE_OTHER): Payer: Medicaid Other | Admitting: "Endocrinology

## 2019-11-06 ENCOUNTER — Encounter: Payer: Self-pay | Admitting: "Endocrinology

## 2019-11-06 ENCOUNTER — Other Ambulatory Visit: Payer: Self-pay

## 2019-11-06 VITALS — Ht 69.0 in | Wt 248.4 lb

## 2019-11-06 DIAGNOSIS — E890 Postprocedural hypothyroidism: Secondary | ICD-10-CM

## 2019-11-06 MED ORDER — LEVOTHYROXINE SODIUM 88 MCG PO TABS: 88.0000 ug | ORAL_TABLET | Freq: Every day | ORAL | 0 refills | Status: DC

## 2019-11-06 NOTE — Progress Notes (Signed)
11/06/2019, 12:31 PM                                     Endocrinology Telehealth Visit Follow up Note -During COVID -19 Pandemic  This visit type was conducted  via telephone due to national recommendations for restrictions regarding the COVID-19 Pandemic  in an effort to limit this patient's exposure and mitigate transmission of the corona virus.   I connected with Cheyenne Campbell on 11/06/2019   by telephone and verified that I am speaking with the correct person using two identifiers. [employer], [birth year]. she has verbally consented to this visit.  I was in my office and patient was in her residence. No other persons were with me during the encounter. All issues noted in this document were discussed and addressed. The format was not optimal for physical exam.    Subjective:    Patient ID: Cheyenne Campbell, female    DOB: 11/06/1970, PCP Dorena Bodo, PA-C   Past Medical History:  Diagnosis Date  . Thyroid disease    History reviewed. No pertinent surgical history. Social History   Socioeconomic History  . Marital status: Single    Spouse name: Not on file  . Number of children: Not on file  . Years of education: Not on file  . Highest education level: Not on file  Occupational History  . Not on file  Tobacco Use  . Smoking status: Never Smoker  . Smokeless tobacco: Never Used  Vaping Use  . Vaping Use: Never used  Substance and Sexual Activity  . Alcohol use: No  . Drug use: No  . Sexual activity: Not on file  Other Topics Concern  . Not on file  Social History Narrative  . Not on file   Social Determinants of Health   Financial Resource Strain:   . Difficulty of Paying Living Expenses: Not on file  Food Insecurity:   . Worried About Programme researcher, broadcasting/film/video in the Last Year: Not on file  . Ran Out of Food in the Last Year: Not on file  Transportation Needs:   . Lack of Transportation (Medical): Not on file   . Lack of Transportation (Non-Medical): Not on file  Physical Activity:   . Days of Exercise per Week: Not on file  . Minutes of Exercise per Session: Not on file  Stress:   . Feeling of Stress : Not on file  Social Connections:   . Frequency of Communication with Friends and Family: Not on file  . Frequency of Social Gatherings with Friends and Family: Not on file  . Attends Religious Services: Not on file  . Active Member of Clubs or Organizations: Not on file  . Attends Banker Meetings: Not on file  . Marital Status: Not on file   Outpatient Encounter Medications as of 11/06/2019  Medication Sig  . amLODipine (NORVASC) 10 MG tablet Take 0.5 tablets (5 mg total) by mouth daily.  . Cyanocobalamin (VITAMIN B-12) 1000 MCG/15ML LIQD Take by mouth.  . levothyroxine (SYNTHROID) 88 MCG tablet Take 1 tablet (88 mcg total) by mouth daily before breakfast.  .  VITAMIN D PO Take by mouth daily.  . [DISCONTINUED] levothyroxine (SYNTHROID) 100 MCG tablet TAKE 1 TABLET BY MOUTH DAILY BEFORE BREAKFAST.   No facility-administered encounter medications on file as of 11/06/2019.   ALLERGIES: No Known Allergies  VACCINATION STATUS: Immunization History  Administered Date(s) Administered  . Influenza Split 11/01/2012  . Tdap 09/29/2017    HPI Cheyenne Campbell is 49 y.o. female who is being engaged in telehealth via telephone in follow-up for RAI induced hypothyroidism.    She was given I-131 thyroid ablation on Jul 14, 2018. She is currently on levothyroxine 100 mcg p.o. daily before breakfast.    She reports compliance.  She feels better.  Her previsit labs show evidence of over replacement.  She has no new complaints today. -She has history of goiter. She has unidentified thyroid dysfunction in 1 of her sisters.  Review of Systems  Limited as above.  Objective:    Ht 5\' 9"  (1.753 m)   Wt 248 lb 6.4 oz (112.7 kg)   BMI 36.68 kg/m   Wt Readings from Last 3 Encounters:   11/06/19 248 lb 6.4 oz (112.7 kg)  08/01/19 245 lb 6.4 oz (111.3 kg)  08/22/18 231 lb (104.8 kg)    Physical Exam     CMP ( most recent) CMP     Component Value Date/Time   NA 139 09/29/2017 0841   K 3.9 09/29/2017 0841   CL 101 09/29/2017 0841   CO2 31 09/29/2017 0841   GLUCOSE 85 09/29/2017 0841   BUN 8 09/29/2017 0841   CREATININE 0.78 09/29/2017 0841   CALCIUM 9.5 09/29/2017 0841   PROT 8.3 (H) 09/29/2017 0841   AST 17 09/29/2017 0841   ALT 22 09/29/2017 0841   BILITOT 0.6 09/29/2017 0841   GFRNONAA 90 09/29/2017 0841   GFRAA 104 09/29/2017 0841     April 25, 2018 thyroid uptake and scan: 57.7% uniform uptake in 24 hours consistent with Graves' disease.  Recent Results (from the past 2160 hour(s))  T4, free     Status: None   Collection Time: 10/31/19 11:35 AM  Result Value Ref Range   Free T4 1.8 0.8 - 1.8 ng/dL  TSH     Status: Abnormal   Collection Time: 10/31/19 11:35 AM  Result Value Ref Range   TSH 0.02 (L) mIU/L    Comment:           Reference Range .           > or = 20 Years  0.40-4.50 .                Pregnancy Ranges           First trimester    0.26-2.66           Second trimester   0.55-2.73           Third trimester    0.43-2.91      Assessment & Plan:   1.  RAI induced hypothyroidism  2.  Graves' disease-history of   She is status post I-131 thyroid ablation for Graves' disease on Jul 14, 2018. Her most recent thyroid function tests are consistent with slight over replacement.  I discussed and lowered her levothyroxine to 88 mcg p.o. daily before breakfast.     - We discussed about the correct intake of her thyroid hormone, on empty stomach at fasting, with water, separated by at least 30 minutes from breakfast and other medications,  and separated by  more than 4 hours from calcium, iron, multivitamins, acid reflux medications (PPIs). -Patient is made aware of the fact that thyroid hormone replacement is needed for life, dose to be  adjusted by periodic monitoring of thyroid function tests.  - I advised patient to maintain close follow up with Dorena Bodo, PA-C for primary care needs.      - Time spent on this patient care encounter:  20 minutes of which 50% was spent in  counseling and the rest reviewing  her current and  previous labs / studies and medications  doses and developing a plan for long term care. Cheyenne Campbell  participated in the discussions, expressed understanding, and voiced agreement with the above plans.  All questions were answered to her satisfaction. she is encouraged to contact clinic should she have any questions or concerns prior to her return visit.   Follow up plan: Return in about 3 months (around 02/06/2020) for F/U with Pre-visit Labs.   Marquis Lunch, MD [facility] [address] [place], [place] [address] Phone: [phone]  Fax: [phone]     11/06/2019, 12:31 PM  This note was partially dictated with voice recognition software. Similar sounding words can be transcribed inadequately or may not  be corrected upon review.

## 2019-11-08 ENCOUNTER — Encounter: Payer: Self-pay | Admitting: Nurse Practitioner

## 2019-11-08 ENCOUNTER — Ambulatory Visit (INDEPENDENT_AMBULATORY_CARE_PROVIDER_SITE_OTHER): Payer: Medicaid Other | Admitting: Nurse Practitioner

## 2019-11-08 ENCOUNTER — Other Ambulatory Visit: Payer: Self-pay

## 2019-11-08 VITALS — BP 142/84 | HR 88 | Temp 98.3°F | Resp 14 | Ht 69.0 in | Wt 248.0 lb

## 2019-11-08 DIAGNOSIS — H1013 Acute atopic conjunctivitis, bilateral: Secondary | ICD-10-CM | POA: Diagnosis not present

## 2019-11-08 MED ORDER — OLOPATADINE HCL 0.1 % OP SOLN: 1.0000 [drp] | Freq: Two times a day (BID) | OPHTHALMIC | 3 refills | Status: DC

## 2019-11-08 MED ORDER — VIVA DROPS 1 % OP SOLN: 1.0000 [drp] | Freq: Four times a day (QID) | OPHTHALMIC | 1 refills | Status: DC | PRN

## 2019-11-08 NOTE — Progress Notes (Signed)
Established Patient Office Visit  Subjective:  Patient ID: Cheyenne Campbell, female    DOB: 15-Mar-1971  Age: 49 y.o. MRN: [id number]  CC:  Chief Complaint  Patient presents with  . B Eye Irritation    x2 days- red itchy eyes, no drainage or crusts- no injury- no changes to vision    HPI Cheyenne Campbell is a 49 year old female presenting to the clinic with complaints of bilateral eye irritation, itching, and redness. Two days ago while working at [employer] she began to have bilateral ocular pruritis. She denied nasal congestion or drainage, fever, chills, general body aches, cough, h/a, gu/gi sxs, no past 2 weeks h/o URI, no sick contacts, no others with similar sxs. She denied h/o known allergies or a known exposure to her eyes. She has used Visine that did relieve her sxs temporarily.   Past Medical History:  Diagnosis Date  . Thyroid disease     History reviewed. No pertinent surgical history.  History reviewed. No pertinent family history.  Social History   Socioeconomic History  . Marital status: Single    Spouse name: Not on file  . Number of children: Not on file  . Years of education: Not on file  . Highest education level: Not on file  Occupational History  . Not on file  Tobacco Use  . Smoking status: Never Smoker  . Smokeless tobacco: Never Used  Vaping Use  . Vaping Use: Never used  Substance and Sexual Activity  . Alcohol use: No  . Drug use: No  . Sexual activity: Not on file  Other Topics Concern  . Not on file  Social History Narrative  . Not on file   Social Determinants of Health   Financial Resource Strain:   . Difficulty of Paying Living Expenses: Not on file  Food Insecurity:   . Worried About Programme researcher, broadcasting/film/video in the Last Year: Not on file  . Ran Out of Food in the Last Year: Not on file  Transportation Needs:   . Lack of Transportation (Medical): Not on file  . Lack of Transportation (Non-Medical): Not on file  Physical Activity:   . Days  of Exercise per Week: Not on file  . Minutes of Exercise per Session: Not on file  Stress:   . Feeling of Stress : Not on file  Social Connections:   . Frequency of Communication with Friends and Family: Not on file  . Frequency of Social Gatherings with Friends and Family: Not on file  . Attends Religious Services: Not on file  . Active Member of Clubs or Organizations: Not on file  . Attends Banker Meetings: Not on file  . Marital Status: Not on file  Intimate Partner Violence:   . Fear of Current or Ex-Partner: Not on file  . Emotionally Abused: Not on file  . Physically Abused: Not on file  . Sexually Abused: Not on file    Outpatient Medications Prior to Visit  Medication Sig Dispense Refill  . amLODipine (NORVASC) 10 MG tablet Take 0.5 tablets (5 mg total) by mouth daily. 30 tablet 3  . Cyanocobalamin (VITAMIN B-12) 1000 MCG/15ML LIQD Take by mouth.    . levothyroxine (SYNTHROID) 88 MCG tablet Take 1 tablet (88 mcg total) by mouth daily before breakfast. 90 tablet 0  . VITAMIN D PO Take by mouth daily. (Patient not taking: Reported on 11/08/2019)     No facility-administered medications prior to visit.    No  Known Allergies  ROS Review of Systems  All other systems reviewed and are negative.     Objective:    Physical Exam Vitals and nursing note reviewed.  Constitutional:      General: She is not in acute distress.    Appearance: Normal appearance. She is well-developed and well-groomed. She is not ill-appearing.  HENT:     Head: Normocephalic and atraumatic.     Right Ear: Hearing normal.     Left Ear: Hearing normal.     Nose: Nose normal.     Mouth/Throat:     Lips: Pink.  Eyes:     General: Lids are normal. Lids are everted, no foreign bodies appreciated. Gaze aligned appropriately. No allergic shiner or scleral icterus.    Extraocular Movements: Extraocular movements intact.     Right eye: No nystagmus.     Left eye: No nystagmus.      Conjunctiva/sclera:     Right eye: Right conjunctiva is injected. No exudate or hemorrhage.    Left eye: Left conjunctiva is injected. No exudate or hemorrhage.    Pupils: Pupils are equal, round, and reactive to light.  Cardiovascular:     Rate and Rhythm: Normal rate.  Pulmonary:     Effort: Pulmonary effort is normal.  Musculoskeletal:     Cervical back: Normal range of motion and neck supple.  Lymphadenopathy:     Cervical: No cervical adenopathy.  Skin:    General: Skin is warm and dry.     Coloration: Skin is not jaundiced or pale.  Neurological:     General: No focal deficit present.     Mental Status: She is alert.  Psychiatric:        Mood and Affect: Mood normal.        Behavior: Behavior is cooperative.        Thought Content: Thought content normal.     BP (!) 142/84   Pulse 88   Temp 98.3 F (36.8 C) (Temporal)   Resp 14   Ht 5\' 9"  (1.753 m)   Wt 248 lb (112.5 kg)   SpO2 98%   BMI 36.62 kg/m  Wt Readings from Last 3 Encounters:  11/08/19 248 lb (112.5 kg)  11/06/19 248 lb 6.4 oz (112.7 kg)  08/01/19 245 lb 6.4 oz (111.3 kg)     Health Maintenance Due  Topic Date Due  . Hepatitis C Screening  Never done  . COLONOSCOPY  Never done  . INFLUENZA VACCINE  10/14/2019    There are no preventive care reminders to display for this patient.  Lab Results  Component Value Date   TSH 0.02 (L) 10/31/2019   Lab Results  Component Value Date   WBC 5.0 09/29/2017   HGB 11.6 (L) 09/29/2017   HCT 34.8 (L) 09/29/2017   MCV 79.3 (L) 09/29/2017   PLT 358 09/29/2017   Lab Results  Component Value Date   NA 139 09/29/2017   K 3.9 09/29/2017   CO2 31 09/29/2017   GLUCOSE 85 09/29/2017   BUN 8 09/29/2017   CREATININE 0.78 09/29/2017   BILITOT 0.6 09/29/2017   AST 17 09/29/2017   ALT 22 09/29/2017   PROT 8.3 (H) 09/29/2017   CALCIUM 9.5 09/29/2017   ANIONGAP 7 03/12/2017   Lab Results  Component Value Date   CHOL 149 09/29/2017   Lab Results    Component Value Date   HDL 58 09/29/2017   Lab Results  Component Value Date  LDLCALC 74 09/29/2017   Lab Results  Component Value Date   TRIG 85 09/29/2017   Lab Results  Component Value Date   CHOLHDL 2.6 09/29/2017   No results found for: HGBA1C    Assessment & Plan:   Problem List Items Addressed This Visit    None    Visit Diagnoses    Allergic conjunctivitis of both eyes    -  Primary   Relevant Medications   Polysorbate 80, PF, (VIVA DROPS) 1 % SOLN   olopatadine (PATANOL) 0.1 % ophthalmic solution    Your sxs are consistent with allergic conjunctivitis: use viva drops for moisture and Patanol as directed for allergy relief. You may also take OTC Claritin daily. Warm compresses and Tylenol may help with discomfort.   Meds ordered this encounter  Medications  . Polysorbate 80, PF, (VIVA DROPS) 1 % SOLN    Sig: Apply 1-2 drops to eye 4 (four) times daily as needed (each eye).    Dispense:  10 mL    Refill:  1  . olopatadine (PATANOL) 0.1 % ophthalmic solution    Sig: Place 1 drop into both eyes 2 (two) times daily.    Dispense:  5 mL    Refill:  3    Follow-up: Return if symptoms worsen or fail to improve.    Elmore Guise, FNP

## 2019-11-08 NOTE — Patient Instructions (Signed)
Your sxs are consistent with allergic conjunctivitis: use viva drops for moisture and Patanol as directed for allergy relief. You may also take OTC Claritin daily. Warm compresses and Tylenol may help with discomfort.

## 2019-11-12 ENCOUNTER — Telehealth: Payer: Self-pay | Admitting: *Deleted

## 2019-11-12 DIAGNOSIS — H1013 Acute atopic conjunctivitis, bilateral: Secondary | ICD-10-CM

## 2019-11-12 MED ORDER — OLOPATADINE HCL 0.1 % OP SOLN: 1.0000 [drp] | Freq: Two times a day (BID) | OPHTHALMIC | 3 refills | Status: AC

## 2019-11-12 NOTE — Telephone Encounter (Signed)
Received request from pharmacy for PA on Patanol.  PA submitted.   Dx: H10.13- allergic conjunctivitis.   Received immediate determination.   PA Case [id number] approved 11/12/2019- 11/11/2020.

## 2019-11-14 DIAGNOSIS — Z419 Encounter for procedure for purposes other than remedying health state, unspecified: Secondary | ICD-10-CM | POA: Diagnosis not present

## 2019-12-14 DIAGNOSIS — Z419 Encounter for procedure for purposes other than remedying health state, unspecified: Secondary | ICD-10-CM | POA: Diagnosis not present

## 2020-01-11 ENCOUNTER — Other Ambulatory Visit: Payer: Self-pay | Admitting: "Endocrinology

## 2020-01-14 DIAGNOSIS — Z419 Encounter for procedure for purposes other than remedying health state, unspecified: Secondary | ICD-10-CM | POA: Diagnosis not present

## 2020-01-24 ENCOUNTER — Telehealth: Payer: Self-pay

## 2020-01-24 NOTE — Telephone Encounter (Signed)
Pt needed print out of Shot record.

## 2020-01-30 ENCOUNTER — Other Ambulatory Visit: Payer: Self-pay

## 2020-01-30 DIAGNOSIS — E0500 Thyrotoxicosis with diffuse goiter without thyrotoxic crisis or storm: Secondary | ICD-10-CM

## 2020-01-30 DIAGNOSIS — E890 Postprocedural hypothyroidism: Secondary | ICD-10-CM

## 2020-02-01 LAB — T4, FREE: Free T4: 1.7 ng/dL (ref 0.8–1.8)

## 2020-02-01 LAB — TSH: TSH: 0.01 mIU/L — ABNORMAL LOW

## 2020-02-03 ENCOUNTER — Other Ambulatory Visit: Payer: Self-pay | Admitting: "Endocrinology

## 2020-02-06 ENCOUNTER — Telehealth (INDEPENDENT_AMBULATORY_CARE_PROVIDER_SITE_OTHER): Payer: Medicaid Other | Admitting: "Endocrinology

## 2020-02-06 ENCOUNTER — Encounter: Payer: Self-pay | Admitting: "Endocrinology

## 2020-02-06 DIAGNOSIS — E890 Postprocedural hypothyroidism: Secondary | ICD-10-CM

## 2020-02-06 MED ORDER — LEVOTHYROXINE SODIUM 75 MCG PO TABS: 75.0000 ug | ORAL_TABLET | Freq: Every day | ORAL | 1 refills | Status: DC

## 2020-02-06 NOTE — Progress Notes (Signed)
02/06/2020, 12:07 PM                                         Endocrinology Telehealth Visit Follow up Note -During COVID -19 Pandemic  This visit type was conducted  via telephone due to national recommendations for restrictions regarding the COVID-19 Pandemic  in an effort to limit this patient's exposure and mitigate transmission of the corona virus.   I connected with Cheyenne Campbell on 02/06/2020   by telephone and verified that I am speaking with the correct person using two identifiers. [employer], 14-Aug-1970. she has verbally consented to this visit.  I was in my office and patient was in her residence. No other persons were with me during the encounter. All issues noted in this document were discussed and addressed. The format was not optimal for physical exam.   Subjective:    Patient ID: Cheyenne Campbell, female    DOB: [birth year], PCP Cheyenne Brooks, MD   Past Medical History:  Diagnosis Date   Thyroid disease    History reviewed. No pertinent surgical history. Social History   Socioeconomic History   Marital status: Single    Spouse name: Not on file   Number of children: Not on file   Years of education: Not on file   Highest education level: Not on file  Occupational History   Not on file  Tobacco Use   Smoking status: Never Smoker   Smokeless tobacco: Never Used  Vaping Use   Vaping Use: Never used  Substance and Sexual Activity   Alcohol use: No   Drug use: No   Sexual activity: Not on file  Other Topics Concern   Not on file  Social History Narrative   Not on file   Social Determinants of Health   Financial Resource Strain:    Difficulty of Paying Living Expenses: Not on file  Food Insecurity:    Worried About Running Out of Food in the Last Year: Not on file   Ran Out of Food in the Last Year: Not on file  Transportation Needs:    Lack of Transportation (Medical): Not on  file   Lack of Transportation (Non-Medical): Not on file  Physical Activity:    Days of Exercise per Week: Not on file   Minutes of Exercise per Session: Not on file  Stress:    Feeling of Stress : Not on file  Social Connections:    Frequency of Communication with Friends and Family: Not on file   Frequency of Social Gatherings with Friends and Family: Not on file   Attends Religious Services: Not on file   Active Member of Clubs or Organizations: Not on file   Attends Club or Organization Meetings: Not on file   Marital Status: Not on file   Outpatient Encounter Medications as of 02/06/2020  Medication Sig   amLODipine (NORVASC) 10 MG tablet Take 0.5 tablets (5 mg total) by mouth daily.   Cyanocobalamin (VITAMIN B-12) 1000 MCG/15ML LIQD Take by mouth.   levothyroxine (SYNTHROID) 75 MCG tablet Take 1 tablet (75 mcg total) by mouth  daily before breakfast.   olopatadine (PATANOL) 0.1 % ophthalmic solution Place 1 drop into both eyes 2 (two) times daily.   Polysorbate 80, PF, (VIVA DROPS) 1 % SOLN Apply 1-2 drops to eye 4 (four) times daily as needed (each eye).   VITAMIN D PO Take by mouth daily. (Patient not taking: Reported on 11/08/2019)   [DISCONTINUED] levothyroxine (SYNTHROID) 88 MCG tablet TAKE 1 TABLET (88 MCG TOTAL) BY MOUTH DAILY BEFORE BREAKFAST.   No facility-administered encounter medications on file as of 02/06/2020.   ALLERGIES: No Known Allergies  VACCINATION STATUS: Immunization History  Administered Date(s) Administered   Influenza Split 11/01/2012   PFIZER SARS-COV-2 Vaccination 10/10/2019, 10/31/2019   Tdap 09/29/2017    HPI Cheyenne Campbell is 49 y.o. female who is being engaged in telehealth via telephone in follow-up for RAI induced hypothyroidism.    She was given I-131 thyroid ablation on Jul 14, 2018. She is currently on levothyroxine 88 mcg p.o. daily before breakfast. Her previsit labs are consistent with slight over replacement.   She  reports compliance.  She feels better.   She has no new complaints today. -She has history of goiter. She has unidentified thyroid dysfunction in 1 of her sisters.  Review of Systems  Limited as above.  Objective:    There were no vitals taken for this visit.  Wt Readings from Last 3 Encounters:  11/08/19 248 lb (112.5 kg)  11/06/19 248 lb 6.4 oz (112.7 kg)  08/01/19 245 lb 6.4 oz (111.3 kg)    Physical Exam     CMP ( most recent) CMP     Component Value Date/Time   NA 139 09/29/2017 0841   K 3.9 09/29/2017 0841   CL 101 09/29/2017 0841   CO2 31 09/29/2017 0841   GLUCOSE 85 09/29/2017 0841   BUN 8 09/29/2017 0841   CREATININE 0.78 09/29/2017 0841   CALCIUM 9.5 09/29/2017 0841   PROT 8.3 (H) 09/29/2017 0841   AST 17 09/29/2017 0841   ALT 22 09/29/2017 0841   BILITOT 0.6 09/29/2017 0841   GFRNONAA 90 09/29/2017 0841   GFRAA 104 09/29/2017 0841     April 25, 2018 thyroid uptake and scan: 57.7% uniform uptake in 24 hours consistent with Graves' disease.  Recent Results (from the past 2160 hour(s))  T4, Free     Status: None   Collection Time: 01/31/20  3:23 PM  Result Value Ref Range   Free T4 1.7 0.8 - 1.8 ng/dL  TSH     Status: Abnormal   Collection Time: 01/31/20  3:23 PM  Result Value Ref Range   TSH 0.01 (L) mIU/L    Comment:           Reference Range .           > or = 20 Years  0.40-4.50 .                Pregnancy Ranges           First trimester    0.26-2.66           Second trimester   0.55-2.73           Third trimester    0.43-2.91      Assessment & Plan:   1.  RAI induced hypothyroidism  2.  Graves' disease-history of   She is status post I-131 thyroid ablation for Graves' disease on Jul 14, 2018. Her most recent thyroid function tests are consistent with slight over  replacement. I discussed and lowered her levothyroxine to 75 mcg p.o. daily before breakfast.    - We discussed about the correct intake of her thyroid hormone, on  empty stomach at fasting, with water, separated by at least 30 minutes from breakfast and other medications,  and separated by more than 4 hours from calcium, iron, multivitamins, acid reflux medications (PPIs). -Patient is made aware of the fact that thyroid hormone replacement is needed for life, dose to be adjusted by periodic monitoring of thyroid function tests.   - I advised patient to maintain close follow up with Cheyenne Brooks, MD for primary care needs.    - Time spent on this patient care encounter:  20 minutes of which 50% was spent in  counseling and the rest reviewing  her current and  previous labs / studies and medications  doses and developing a plan for long term care. Aadya Fogal  participated in the discussions, expressed understanding, and voiced agreement with the above plans.  All questions were answered to her satisfaction. she is encouraged to contact clinic should she have any questions or concerns prior to her return visit.   Follow up plan: Return in about 6 months (around 08/05/2020) for F/U with Pre-visit Labs.   Marquis Lunch, MD [facility] [address] [place], [place] [address] Phone: [phone]  Fax: [phone]     02/06/2020, 12:07 PM  This note was partially dictated with voice recognition software. Similar sounding words can be transcribed inadequately or may not  be corrected upon review.

## 2020-02-13 DIAGNOSIS — Z419 Encounter for procedure for purposes other than remedying health state, unspecified: Secondary | ICD-10-CM | POA: Diagnosis not present

## 2020-03-15 DIAGNOSIS — Z419 Encounter for procedure for purposes other than remedying health state, unspecified: Secondary | ICD-10-CM | POA: Diagnosis not present

## 2020-04-15 DIAGNOSIS — Z419 Encounter for procedure for purposes other than remedying health state, unspecified: Secondary | ICD-10-CM | POA: Diagnosis not present

## 2020-05-13 DIAGNOSIS — Z419 Encounter for procedure for purposes other than remedying health state, unspecified: Secondary | ICD-10-CM | POA: Diagnosis not present

## 2020-06-13 DIAGNOSIS — Z419 Encounter for procedure for purposes other than remedying health state, unspecified: Secondary | ICD-10-CM | POA: Diagnosis not present

## 2020-07-13 DIAGNOSIS — Z419 Encounter for procedure for purposes other than remedying health state, unspecified: Secondary | ICD-10-CM | POA: Diagnosis not present

## 2020-07-23 ENCOUNTER — Telehealth: Payer: Self-pay | Admitting: "Endocrinology

## 2020-07-23 DIAGNOSIS — E890 Postprocedural hypothyroidism: Secondary | ICD-10-CM | POA: Diagnosis not present

## 2020-07-23 LAB — TSH: TSH: 0.02 mIU/L — ABNORMAL LOW

## 2020-07-23 LAB — T4, FREE: Free T4: 1.6 ng/dL (ref 0.8–1.8)

## 2020-07-23 MED ORDER — LEVOTHYROXINE SODIUM 75 MCG PO TABS: 75.0000 ug | ORAL_TABLET | Freq: Every day | ORAL | 1 refills | Status: DC

## 2020-07-23 NOTE — Telephone Encounter (Signed)
rx sent

## 2020-07-23 NOTE — Telephone Encounter (Signed)
Pt is calling and states she is going out of town and will be short 2 pills and requesting a refill  levothyroxine (SYNTHROID) 75 MCG tablet   [id number] - [place], [place] - [address] AT [facility] [address] Phone:  [phone]  Fax:  [phone]

## 2020-07-30 ENCOUNTER — Other Ambulatory Visit: Payer: Self-pay

## 2020-07-30 ENCOUNTER — Encounter: Payer: Self-pay | Admitting: "Endocrinology

## 2020-07-30 ENCOUNTER — Ambulatory Visit (INDEPENDENT_AMBULATORY_CARE_PROVIDER_SITE_OTHER): Payer: Medicaid Other | Admitting: "Endocrinology

## 2020-07-30 VITALS — BP 142/83 | HR 87 | Ht 69.0 in | Wt 245.0 lb

## 2020-07-30 DIAGNOSIS — E0500 Thyrotoxicosis with diffuse goiter without thyrotoxic crisis or storm: Secondary | ICD-10-CM | POA: Diagnosis not present

## 2020-07-30 DIAGNOSIS — E890 Postprocedural hypothyroidism: Secondary | ICD-10-CM

## 2020-07-30 MED ORDER — LEVOTHYROXINE SODIUM 50 MCG PO TABS: 50.0000 ug | ORAL_TABLET | Freq: Every day | ORAL | 1 refills | Status: DC

## 2020-07-30 NOTE — Progress Notes (Signed)
07/30/2020, 12:45 PM                 Endocrinology follow-up note   Subjective:    Patient ID: Cheyenne Campbell, female    DOB: 04-15-1970, PCP Donita Brooks, MD   Past Medical History:  Diagnosis Date  . Thyroid disease    History reviewed. No pertinent surgical history. Social History   Socioeconomic History  . Marital status: Single    Spouse name: Not on file  . Number of children: Not on file  . Years of education: Not on file  . Highest education level: Not on file  Occupational History  . Not on file  Tobacco Use  . Smoking status: Never Smoker  . Smokeless tobacco: Never Used  Vaping Use  . Vaping Use: Never used  Substance and Sexual Activity  . Alcohol use: No  . Drug use: No  . Sexual activity: Not on file  Other Topics Concern  . Not on file  Social History Narrative  . Not on file   Social Determinants of Health   Financial Resource Strain: Not on file  Food Insecurity: Not on file  Transportation Needs: Not on file  Physical Activity: Not on file  Stress: Not on file  Social Connections: Not on file   Outpatient Encounter Medications as of 07/30/2020  Medication Sig  . amLODipine (NORVASC) 10 MG tablet Take 0.5 tablets (5 mg total) by mouth daily.  . Cyanocobalamin (VITAMIN B-12) 1000 MCG/15ML LIQD Take by mouth.  . levothyroxine (SYNTHROID) 50 MCG tablet Take 1 tablet (50 mcg total) by mouth daily before breakfast.  . olopatadine (PATANOL) 0.1 % ophthalmic solution Place 1 drop into both eyes 2 (two) times daily.  . Polysorbate 80, PF, (VIVA DROPS) 1 % SOLN Apply 1-2 drops to eye 4 (four) times daily as needed (each eye).  Marland Kitchen VITAMIN D PO Take by mouth daily. (Patient not taking: Reported on 11/08/2019)  . [DISCONTINUED] levothyroxine (SYNTHROID) 75 MCG tablet Take 1 tablet (75 mcg total) by mouth daily before breakfast.   No facility-administered encounter medications on file as of  07/30/2020.   ALLERGIES: No Known Allergies  VACCINATION STATUS: Immunization History  Administered Date(s) Administered  . Influenza Split 11/01/2012  . PFIZER(Purple Top)SARS-COV-2 Vaccination 10/10/2019, 10/31/2019  . Tdap 09/29/2017    HPI Cheyenne Campbell is 50 y.o. female who is being seen in follow-up for  RAI induced hypothyroidism.    She was given I-131 thyroid ablation on Jul 14, 2018. She is currently on levothyroxine 75 mcg p.o. daily before breakfast. Her previsit labs are consistent with slight over replacement.   She reports compliance.  She feels better.   She has no new complaints today. -She has history of goiter. She has unidentified thyroid dysfunction in 1 of her sisters.  Review of Systems  Limited as above.  Objective:    BP (!) 142/83   Pulse 87   Ht 5\' 9"  (1.753 m)   Wt 245 lb (111.1 kg)   BMI 36.18 kg/m   Wt Readings from Last 3 Encounters:  07/30/20 245 lb (111.1 kg)  11/08/19 248 lb (112.5 kg)  11/06/19 248 lb 6.4 oz (112.7 kg)  Physical Exam     CMP ( most recent) CMP     Component Value Date/Time   NA 139 09/29/2017 0841   K 3.9 09/29/2017 0841   CL 101 09/29/2017 0841   CO2 31 09/29/2017 0841   GLUCOSE 85 09/29/2017 0841   BUN 8 09/29/2017 0841   CREATININE 0.78 09/29/2017 0841   CALCIUM 9.5 09/29/2017 0841   PROT 8.3 (H) 09/29/2017 0841   AST 17 09/29/2017 0841   ALT 22 09/29/2017 0841   BILITOT 0.6 09/29/2017 0841   GFRNONAA 90 09/29/2017 0841   GFRAA 104 09/29/2017 0841     April 25, 2018 thyroid uptake and scan: 57.7% uniform uptake in 24 hours consistent with Graves' disease.  Recent Results (from the past 2160 hour(s))  TSH     Status: Abnormal   Collection Time: 07/23/20 12:00 AM  Result Value Ref Range   TSH 0.02 (L) mIU/L    Comment:           Reference Range .           > or = 20 Years  0.40-4.50 .                Pregnancy Ranges           First trimester    0.26-2.66           Second trimester    0.55-2.73           Third trimester    0.43-2.91   T4, free     Status: None   Collection Time: 07/23/20 12:00 AM  Result Value Ref Range   Free T4 1.6 0.8 - 1.8 ng/dL     Assessment & Plan:   1.  RAI induced hypothyroidism  2.  Graves' disease-history of   She is status post I-131 thyroid ablation for Graves' disease on Jul 14, 2018. Her most recent thyroid function tests are consistent with slight over replacement.  I discussed and lowered her levothyroxine to 50 mcg p.o. daily before breakfast.   There is some possibility of treatment failure, however she will benefit from staying on a lower dose of levothyroxine.    Will have repeat labs before her next visit in 5 months.  - We discussed about the correct intake of her thyroid hormone, on empty stomach at fasting, with water, separated by at least 30 minutes from breakfast and other medications,  and separated by more than 4 hours from calcium, iron, multivitamins, acid reflux medications (PPIs). -Patient is made aware of the fact that thyroid hormone replacement is needed for life, dose to be adjusted by periodic monitoring of thyroid function tests.   - I advised patient to maintain close follow up with Donita Brooks, MD for primary care needs.   I spent 25 minutes in the care of the patient today including review of labs from Thyroid Function,  other relevant labs ; imaging (current and previous including abstractions from other facilities); face-to-face time discussing  her lab results and symptoms, medications doses, her options of short and long term treatment based on the latest standards of care / guidelines;   and documenting the encounter.  Milika Hakes  participated in the discussions, expressed understanding, and voiced agreement with the above plans.  All questions were answered to her satisfaction. she is encouraged to contact clinic should she have any questions or concerns prior to her return visit.   Follow up  plan: Return in about 5 months (  around 12/30/2020) for F/U with Pre-visit Labs.   Marquis Lunch, MD [facility] [address] [place], [place] [address] Phone: [phone]  Fax: [phone]     07/30/2020, 12:45 PM  This note was partially dictated with voice recognition software. Similar sounding words can be transcribed inadequately or may not  be corrected upon review.

## 2020-08-05 ENCOUNTER — Ambulatory Visit: Payer: Medicaid Other | Admitting: "Endocrinology

## 2020-08-12 ENCOUNTER — Ambulatory Visit: Payer: Medicaid Other | Admitting: "Endocrinology

## 2020-08-13 DIAGNOSIS — Z419 Encounter for procedure for purposes other than remedying health state, unspecified: Secondary | ICD-10-CM | POA: Diagnosis not present

## 2020-09-12 DIAGNOSIS — Z419 Encounter for procedure for purposes other than remedying health state, unspecified: Secondary | ICD-10-CM | POA: Diagnosis not present

## 2020-10-13 DIAGNOSIS — Z419 Encounter for procedure for purposes other than remedying health state, unspecified: Secondary | ICD-10-CM | POA: Diagnosis not present

## 2020-11-13 DIAGNOSIS — Z419 Encounter for procedure for purposes other than remedying health state, unspecified: Secondary | ICD-10-CM | POA: Diagnosis not present

## 2020-12-13 DIAGNOSIS — Z419 Encounter for procedure for purposes other than remedying health state, unspecified: Secondary | ICD-10-CM | POA: Diagnosis not present

## 2020-12-25 ENCOUNTER — Encounter: Payer: Self-pay | Admitting: Family Medicine

## 2020-12-25 ENCOUNTER — Ambulatory Visit (INDEPENDENT_AMBULATORY_CARE_PROVIDER_SITE_OTHER): Payer: Medicaid Other | Admitting: Family Medicine

## 2020-12-25 ENCOUNTER — Other Ambulatory Visit: Payer: Self-pay

## 2020-12-25 VITALS — BP 136/82 | HR 76 | Temp 97.7°F | Resp 12 | Ht 68.5 in | Wt 244.0 lb

## 2020-12-25 DIAGNOSIS — Z124 Encounter for screening for malignant neoplasm of cervix: Secondary | ICD-10-CM | POA: Diagnosis not present

## 2020-12-25 DIAGNOSIS — E890 Postprocedural hypothyroidism: Secondary | ICD-10-CM | POA: Diagnosis not present

## 2020-12-25 DIAGNOSIS — I10 Essential (primary) hypertension: Secondary | ICD-10-CM

## 2020-12-25 DIAGNOSIS — Z23 Encounter for immunization: Secondary | ICD-10-CM

## 2020-12-25 DIAGNOSIS — Z1231 Encounter for screening mammogram for malignant neoplasm of breast: Secondary | ICD-10-CM | POA: Diagnosis not present

## 2020-12-25 DIAGNOSIS — Z1211 Encounter for screening for malignant neoplasm of colon: Secondary | ICD-10-CM | POA: Diagnosis not present

## 2020-12-25 NOTE — Progress Notes (Signed)
Subjective:    Patient ID: Cheyenne Campbell, female    DOB: August 15, 1970, 50 y.o.   MRN: [id number]  HPI  Patient is a very pleasant 50 year old African-American female who is here today for a Pap smear.  She was originally scheduled only for a Pap smear however in reviewing her records she is due for quite a few preventative care measures.  Her last mammogram was in 2021 and is overdue.  She is also never had a colonoscopy.  She refuses a colonoscopy but she will consent to Cologuard.  She is due for a Pap smear.  She is also due for a flu shot.  She is having her thyroid checked by her endocrinologist.  Her last TSH was 0.02 in May.  She has not had a check since.  She is on 50 mcg a day of levothyroxine.  Therefore she is due for fasting lab work as well Past Medical History:  Diagnosis Date   Thyroid disease    History reviewed. No pertinent surgical history. Current Outpatient Medications on File Prior to Visit  Medication Sig Dispense Refill   levothyroxine (SYNTHROID) 50 MCG tablet Take 1 tablet (50 mcg total) by mouth daily before breakfast. 90 tablet 1   amLODipine (NORVASC) 10 MG tablet Take 0.5 tablets (5 mg total) by mouth daily. (Patient not taking: Reported on 12/25/2020) 30 tablet 3   Cyanocobalamin (VITAMIN B-12) 1000 MCG/15ML LIQD Take by mouth. (Patient not taking: Reported on 12/25/2020)     olopatadine (PATANOL) 0.1 % ophthalmic solution Place 1 drop into both eyes 2 (two) times daily. (Patient not taking: Reported on 12/25/2020) 5 mL 3   Polysorbate 80, PF, (VIVA DROPS) 1 % SOLN Apply 1-2 drops to eye 4 (four) times daily as needed (each eye). (Patient not taking: Reported on 12/25/2020) 10 mL 1   VITAMIN D PO Take by mouth daily. (Patient not taking: No sig reported)     No current facility-administered medications on file prior to visit.   No Known Allergies Social History   Socioeconomic History   Marital status: Single    Spouse name: Not on file   Number of  children: Not on file   Years of education: Not on file   Highest education level: Not on file  Occupational History   Not on file  Tobacco Use   Smoking status: Never   Smokeless tobacco: Never  Vaping Use   Vaping Use: Never used  Substance and Sexual Activity   Alcohol use: No   Drug use: No   Sexual activity: Not on file  Other Topics Concern   Not on file  Social History Narrative   Not on file   Social Determinants of Health   Financial Resource Strain: Not on file  Food Insecurity: Not on file  Transportation Needs: Not on file  Physical Activity: Not on file  Stress: Not on file  Social Connections: Not on file  Intimate Partner Violence: Not on file     Review of Systems  All other systems reviewed and are negative.     Objective:   Physical Exam Vitals reviewed. Exam conducted with a chaperone present.  Constitutional:      General: She is not in acute distress.    Appearance: Normal appearance. She is obese. She is not ill-appearing, toxic-appearing or diaphoretic.  Cardiovascular:     Rate and Rhythm: Normal rate and regular rhythm.     Pulses: Normal pulses.     Heart sounds:  Normal heart sounds. No murmur heard.   No gallop.  Pulmonary:     Effort: Pulmonary effort is normal. No respiratory distress.     Breath sounds: Normal breath sounds. No stridor.  Abdominal:     General: Bowel sounds are normal.     Palpations: Abdomen is soft.     Hernia: There is no hernia in the left inguinal area or right inguinal area.  Genitourinary:    Exam position: Lithotomy position.     Pubic Area: No rash.      Labia:        Right: No rash or tenderness.        Left: No rash or tenderness.      Vagina: Normal.     Cervix: No cervical motion tenderness, friability or erythema.     Uterus: Normal.   Musculoskeletal:     Right lower leg: No edema.     Left lower leg: No edema.  Lymphadenopathy:     Lower Body: No right inguinal adenopathy. No left inguinal  adenopathy.  Neurological:     Mental Status: She is alert.          Assessment & Plan:   Cervical cancer screening - Plan: PAP, Thin Prep w/HPV rflx HPV Type 16/18  Hypothyroidism following radioiodine therapy - Plan: CBC with Differential/Platelet, COMPLETE METABOLIC PANEL WITH GFR, Lipid panel, TSH  Essential hypertension - Plan: CBC with Differential/Platelet, COMPLETE METABOLIC PANEL WITH GFR, Lipid panel, TSH  Encounter for screening mammogram for malignant neoplasm of breast - Plan: MM Digital Screening  Colon cancer screening - Plan: Cologuard Pap smear is sent to pathology in a labeled container.  I tried to convince the patient to get a colonoscopy but she will consent to Cologuard.  I will schedule her for mammogram.  Blood pressure today is acceptable at 136/82.  The patient received her flu shot today.  Check CBC CMP fasting lipid panel and TSH when she can return fasting.  Await the results of this preventative care.  Routine anticipatory guidance is provided

## 2020-12-29 ENCOUNTER — Other Ambulatory Visit: Payer: Self-pay | Admitting: "Endocrinology

## 2020-12-29 DIAGNOSIS — E890 Postprocedural hypothyroidism: Secondary | ICD-10-CM | POA: Diagnosis not present

## 2020-12-29 LAB — PAP, TP IMAGING W/ HPV RNA, RFLX HPV TYPE 16,18/45: HPV DNA High Risk: NOT DETECTED

## 2020-12-30 LAB — T4, FREE: Free T4: 1.4 ng/dL (ref 0.8–1.8)

## 2020-12-30 LAB — TSH: TSH: 0.03 mIU/L — ABNORMAL LOW

## 2020-12-31 ENCOUNTER — Ambulatory Visit (INDEPENDENT_AMBULATORY_CARE_PROVIDER_SITE_OTHER): Payer: Medicaid Other | Admitting: "Endocrinology

## 2020-12-31 ENCOUNTER — Encounter: Payer: Self-pay | Admitting: "Endocrinology

## 2020-12-31 ENCOUNTER — Other Ambulatory Visit: Payer: Self-pay

## 2020-12-31 ENCOUNTER — Telehealth: Payer: Self-pay

## 2020-12-31 VITALS — BP 136/78 | HR 84 | Ht 69.0 in | Wt 245.2 lb

## 2020-12-31 DIAGNOSIS — E890 Postprocedural hypothyroidism: Secondary | ICD-10-CM

## 2020-12-31 DIAGNOSIS — E0500 Thyrotoxicosis with diffuse goiter without thyrotoxic crisis or storm: Secondary | ICD-10-CM | POA: Diagnosis not present

## 2020-12-31 DIAGNOSIS — Z1211 Encounter for screening for malignant neoplasm of colon: Secondary | ICD-10-CM | POA: Diagnosis not present

## 2020-12-31 MED ORDER — LEVOTHYROXINE SODIUM 25 MCG PO TABS: 25.0000 ug | ORAL_TABLET | Freq: Every day | ORAL | 2 refills | Status: DC

## 2020-12-31 NOTE — Telephone Encounter (Signed)
Pt states she needs her lab order changed to [facility].

## 2020-12-31 NOTE — Telephone Encounter (Signed)
Lab orders switched to [facility].

## 2020-12-31 NOTE — Progress Notes (Signed)
12/31/2020, 1:50 PM                 Endocrinology follow-up note   Subjective:    Patient ID: Cheyenne Campbell, female    DOB: [birth year], PCP Donita Brooks, MD   Past Medical History:  Diagnosis Date   Thyroid disease    History reviewed. No pertinent surgical history. Social History   Socioeconomic History   Marital status: Single    Spouse name: Not on file   Number of children: Not on file   Years of education: Not on file   Highest education level: Not on file  Occupational History   Not on file  Tobacco Use   Smoking status: Never   Smokeless tobacco: Never  Vaping Use   Vaping Use: Never used  Substance and Sexual Activity   Alcohol use: No   Drug use: No   Sexual activity: Not on file  Other Topics Concern   Not on file  Social History Narrative   Not on file   Social Determinants of Health   Financial Resource Strain: Not on file  Food Insecurity: Not on file  Transportation Needs: Not on file  Physical Activity: Not on file  Stress: Not on file  Social Connections: Not on file   Outpatient Encounter Medications as of 12/31/2020  Medication Sig   Multiple Vitamin (MULTIVITAMIN ADULT PO) Take 1 tablet by mouth daily in the afternoon.   olopatadine (PATANOL) 0.1 % ophthalmic solution Place 1 drop into both eyes 2 (two) times daily.   Polysorbate 80, PF, (VIVA DROPS) 1 % SOLN Apply 1-2 drops to eye 4 (four) times daily as needed (each eye).   amLODipine (NORVASC) 10 MG tablet Take 0.5 tablets (5 mg total) by mouth daily. (Patient not taking: Reported on 12/25/2020)   levothyroxine (SYNTHROID) 25 MCG tablet Take 1 tablet (25 mcg total) by mouth daily before breakfast.   [DISCONTINUED] Cyanocobalamin (VITAMIN B-12) 1000 MCG/15ML LIQD Take by mouth. (Patient not taking: Reported on 12/25/2020)   [DISCONTINUED] levothyroxine (SYNTHROID) 50 MCG tablet Take 1 tablet (50 mcg total) by mouth daily  before breakfast.   [DISCONTINUED] VITAMIN D PO Take by mouth daily. (Patient not taking: No sig reported)   No facility-administered encounter medications on file as of 12/31/2020.   ALLERGIES: No Known Allergies  VACCINATION STATUS: Immunization History  Administered Date(s) Administered   Influenza Split 11/01/2012   Influenza,inj,Quad PF,6+ Mos 12/25/2020   PFIZER Comirnaty(Gray Top)Covid-19 Tri-Sucrose Vaccine 05/03/2020   PFIZER(Purple Top)SARS-COV-2 Vaccination 10/10/2019, 10/31/2019   Tdap 09/29/2017    HPI Cheyenne Campbell is 50 y.o. female who is being seen in follow-up for  RAI induced hypothyroidism.    She was given I-131 thyroid ablation on Jul 14, 2018. She is currently on levothyroxine 50 mcg p.o. daily before breakfast. Her previsit labs are still consistent with slight over replacement.  She has no concerning symptoms at this time.  She denies palpitations, tremors, heat intolerance.    -She has history of goiter. She has unidentified thyroid dysfunction in 1 of her sisters.  Review of Systems  Limited as above.  Objective:    BP 136/78   Pulse 84   Ht 5\' 9"  (  1.753 m)   Wt 245 lb 3.2 oz (111.2 kg)   BMI 36.21 kg/m   Wt Readings from Last 3 Encounters:  12/31/20 245 lb 3.2 oz (111.2 kg)  12/25/20 244 lb (110.7 kg)  07/30/20 245 lb (111.1 kg)    Physical Exam     CMP ( most recent) CMP     Component Value Date/Time   NA 139 09/29/2017 0841   K 3.9 09/29/2017 0841   CL 101 09/29/2017 0841   CO2 31 09/29/2017 0841   GLUCOSE 85 09/29/2017 0841   BUN 8 09/29/2017 0841   CREATININE 0.78 09/29/2017 0841   CALCIUM 9.5 09/29/2017 0841   PROT 8.3 (H) 09/29/2017 0841   AST 17 09/29/2017 0841   ALT 22 09/29/2017 0841   BILITOT 0.6 09/29/2017 0841   GFRNONAA 90 09/29/2017 0841   GFRAA 104 09/29/2017 0841     April 25, 2018 thyroid uptake and scan: 57.7% uniform uptake in 24 hours consistent with Graves' disease.  Recent Results (from the past  2160 hour(s))  PAP,TP IMGw/HPV RNA,rflx HPVTYPE16,18/45     Status: None   Collection Time: 12/25/20  3:27 PM  Result Value Ref Range   Clinical Information:      Comment: None given   LMP:      Comment: NONE GIVEN   PREV. PAP:      Comment: NONE GIVEN   PREV. BX:      Comment: NONE GIVEN   HPV DNA Probe-Source      Comment: None given   STATEMENT OF ADEQUACY:      Comment: Satisfactory for evaluation. Endocervical/transformation zone component absent. Age and/or menstrual status not provided    INTERPRETATION/RESULT:      Comment: Negative for intraepithelial lesion or malignancy.   Comment:      Comment: This Pap test has been evaluated with computer assisted technology.    CYTOTECHNOLOGIST:      Comment: SMP, CT(ASCP) Screening location: [facility] [address] [place], [place] [address]    HPV DNA High Risk Not Detected Not Detected    Comment: Methodology: Transcription-Mediated Amplification . This assay detects E6/E7 viral messenger RNA (mRNA) from 14 high-risk HPV types (16,18,31,33,35,39,45,51,52,56,58,59,66,68). . . Cervical sources are required for HPV testing. If a vaginal source from a patient who has had a total hysterectomy with removal of cervix was  submitted, please contact the testing laboratory for alternative testing options. . For additional information, please refer to http://education.[facility].com/faq/FAQ129v1 (This link if provided for information/ educational purposes only.) EXPLANATORY NOTE:  . The Pap is a screening test for cervical cancer. It is  not a diagnostic test and is subject to false negative  and false positive results. It is most reliable when a  satisfactory sample, regularly obtained, is submitted  with relevant clinical findings and history, and when  the Pap result is evaluated along with historic and  current clinical information. .   TSH     Status: Abnormal   Collection Time: 12/29/20  8:41 AM  Result  Value Ref Range   TSH 0.03 (L) mIU/L    Comment:           Reference Range .           > or = 20 Years  0.40-4.50 .                Pregnancy Ranges           First trimester    0.26-2.66  Second trimester   0.55-2.73           Third trimester    0.43-2.91   T4, free     Status: None   Collection Time: 12/29/20  8:41 AM  Result Value Ref Range   Free T4 1.4 0.8 - 1.8 ng/dL     Assessment & Plan:   1.  RAI induced hypothyroidism  2.  Graves' disease-history of   She is status post I-131 thyroid ablation for Graves' disease on Jul 14, 2018. Her most recent thyroid function tests are consistent with slight over replacement.  I discussed and lowered her levothyroxine to 25 mcg p.o. daily before breakfast.     There is some possibility of treatment failure, however she will benefit from staying on a lower dose of levothyroxine.    - We discussed about the correct intake of her thyroid hormone, on empty stomach at fasting, with water, separated by at least 30 minutes from breakfast and other medications,  and separated by more than 4 hours from calcium, iron, multivitamins, acid reflux medications (PPIs). -Patient is made aware of the fact that thyroid hormone replacement is needed for life, dose to be adjusted by periodic monitoring of thyroid function tests.   Regarding her weight concern: - she acknowledges that there is a room for improvement in her food and drink choices. - Suggestion is made for her to avoid simple carbohydrates  from her diet including Cakes, Sweet Desserts, Ice Cream, Soda (diet and regular), Sweet Tea, Candies, Chips, Cookies, Store Bought Juices, Alcohol in Excess of  1-2 drinks a day, Artificial Sweeteners,  Coffee Creamer, and "Sugar-free" Products, Lemonade. This will help patient to have more stable blood glucose profile and potentially avoid unintended weight gain.   - I advised patient to maintain close follow up with Donita Brooks, MD for  primary care needs.   I spent 20 minutes in the care of the patient today including review of labs from Thyroid Function, CMP, and other relevant labs ; imaging/biopsy records (current and previous including abstractions from other facilities); face-to-face time discussing  her lab results and symptoms, medications doses, her options of short and long term treatment based on the latest standards of care / guidelines;   and documenting the encounter.  Cheyenne Campbell  participated in the discussions, expressed understanding, and voiced agreement with the above plans.  All questions were answered to her satisfaction. she is encouraged to contact clinic should she have any questions or concerns prior to her return visit.   Follow up plan: Return in about 8 weeks (around 02/25/2021) for F/U with Pre-visit Labs.   Marquis Lunch, MD [facility] [address] [place], [place] [address] Phone: [phone]  Fax: [phone]     12/31/2020, 1:50 PM  This note was partially dictated with voice recognition software. Similar sounding words can be transcribed inadequately or may not  be corrected upon review.

## 2020-12-31 NOTE — Patient Instructions (Signed)

## 2021-01-08 LAB — COLOGUARD: COLOGUARD: NEGATIVE

## 2021-01-13 DIAGNOSIS — Z419 Encounter for procedure for purposes other than remedying health state, unspecified: Secondary | ICD-10-CM | POA: Diagnosis not present

## 2021-02-04 ENCOUNTER — Other Ambulatory Visit: Payer: Self-pay

## 2021-02-04 ENCOUNTER — Ambulatory Visit
Admission: RE | Admit: 2021-02-04 | Discharge: 2021-02-04 | Disposition: A | Discharge status: Home / Self Care | Payer: Medicaid Other | Source: Ambulatory Visit | Attending: Family Medicine | Admitting: Family Medicine

## 2021-02-04 DIAGNOSIS — Z1231 Encounter for screening mammogram for malignant neoplasm of breast: Secondary | ICD-10-CM | POA: Diagnosis not present

## 2021-02-12 DIAGNOSIS — Z419 Encounter for procedure for purposes other than remedying health state, unspecified: Secondary | ICD-10-CM | POA: Diagnosis not present

## 2021-02-20 ENCOUNTER — Other Ambulatory Visit: Payer: Self-pay | Admitting: "Endocrinology

## 2021-02-25 ENCOUNTER — Ambulatory Visit: Payer: Medicaid Other | Admitting: "Endocrinology

## 2021-02-26 DIAGNOSIS — E890 Postprocedural hypothyroidism: Secondary | ICD-10-CM | POA: Diagnosis not present

## 2021-02-27 LAB — TSH: TSH: 1.22 mIU/L

## 2021-02-27 LAB — T3, FREE: T3, Free: 2.5 pg/mL (ref 2.3–4.2)

## 2021-02-27 LAB — T4, FREE: Free T4: 1.1 ng/dL (ref 0.8–1.8)

## 2021-03-02 ENCOUNTER — Ambulatory Visit (INDEPENDENT_AMBULATORY_CARE_PROVIDER_SITE_OTHER): Payer: Medicaid Other | Admitting: "Endocrinology

## 2021-03-02 ENCOUNTER — Encounter: Payer: Self-pay | Admitting: "Endocrinology

## 2021-03-02 ENCOUNTER — Other Ambulatory Visit: Payer: Self-pay

## 2021-03-02 VITALS — BP 148/112 | HR 76 | Ht 69.0 in | Wt 250.0 lb

## 2021-03-02 DIAGNOSIS — E890 Postprocedural hypothyroidism: Secondary | ICD-10-CM | POA: Diagnosis not present

## 2021-03-02 DIAGNOSIS — E0500 Thyrotoxicosis with diffuse goiter without thyrotoxic crisis or storm: Secondary | ICD-10-CM

## 2021-03-02 MED ORDER — LEVOTHYROXINE SODIUM 25 MCG PO TABS: 25.0000 ug | ORAL_TABLET | Freq: Every day | ORAL | 1 refills | Status: DC

## 2021-03-02 NOTE — Progress Notes (Signed)
03/02/2021, 5:58 PM                 Endocrinology follow-up note   Subjective:    Patient ID: Cheyenne Campbell, female    DOB: May 06, 1970, PCP Susy Frizzle, MD   Past Medical History:  Diagnosis Date   Thyroid disease    History reviewed. No pertinent surgical history. Social History   Socioeconomic History   Marital status: Single    Spouse name: Not on file   Number of children: Not on file   Years of education: Not on file   Highest education level: Not on file  Occupational History   Not on file  Tobacco Use   Smoking status: Never   Smokeless tobacco: Never  Vaping Use   Vaping Use: Never used  Substance and Sexual Activity   Alcohol use: No   Drug use: No   Sexual activity: Not on file  Other Topics Concern   Not on file  Social History Narrative   Not on file   Social Determinants of Health   Financial Resource Strain: Not on file  Food Insecurity: Not on file  Transportation Needs: Not on file  Physical Activity: Not on file  Stress: Not on file  Social Connections: Not on file   Outpatient Encounter Medications as of 03/02/2021  Medication Sig   levothyroxine (SYNTHROID) 25 MCG tablet Take 1 tablet (25 mcg total) by mouth daily before breakfast.   Multiple Vitamin (MULTIVITAMIN ADULT PO) Take 1 tablet by mouth daily in the afternoon.   olopatadine (PATANOL) 0.1 % ophthalmic solution Place 1 drop into both eyes 2 (two) times daily.   Polysorbate 80, PF, (VIVA DROPS) 1 % SOLN Apply 1-2 drops to eye 4 (four) times daily as needed (each eye).   [DISCONTINUED] amLODipine (NORVASC) 10 MG tablet Take 0.5 tablets (5 mg total) by mouth daily. (Patient not taking: Reported on 12/25/2020)   [DISCONTINUED] levothyroxine (SYNTHROID) 25 MCG tablet TAKE 1 TABLET BY MOUTH DAILY BEFORE BREAKFAST.   No facility-administered encounter medications on file as of 03/02/2021.   ALLERGIES: No Known  Allergies  VACCINATION STATUS: Immunization History  Administered Date(s) Administered   Influenza Split 11/01/2012   Influenza,inj,Quad PF,6+ Mos 12/25/2020   PFIZER Comirnaty(Gray Top)Covid-19 Tri-Sucrose Vaccine 05/03/2020   PFIZER(Purple Top)SARS-COV-2 Vaccination 10/10/2019, 10/31/2019   Tdap 09/29/2017    HPI Cheyenne Campbell is 50 y.o. female who is being seen in follow-up for  RAI induced hypothyroidism.   She was given I-131 thyroid ablation on Jul 14, 2018. She is currently on levothyroxine 25 mcg p.o. daily before breakfast. Her previsit labs are  consistent with euthyroid state.  She has no concerning symptoms at this time.  She denies palpitations, tremors, heat intolerance.    -She has history of goiter. She has unidentified thyroid dysfunction in 1 of her sisters.  Review of Systems  Limited as above.  Objective:    BP (!) 148/112    Pulse 76    Ht 5\' 9"  (1.753 m)    Wt 250 lb (113.4 kg)    BMI 36.92 kg/m   Wt Readings from Last 3 Encounters:  03/02/21 250 lb (113.4 kg)  12/31/20 245  lb 3.2 oz (111.2 kg)  12/25/20 244 lb (110.7 kg)    Physical Exam   CMP ( most recent) CMP     Component Value Date/Time   NA 139 09/29/2017 0841   K 3.9 09/29/2017 0841   CL 101 09/29/2017 0841   CO2 31 09/29/2017 0841   GLUCOSE 85 09/29/2017 0841   BUN 8 09/29/2017 0841   CREATININE 0.78 09/29/2017 0841   CALCIUM 9.5 09/29/2017 0841   PROT 8.3 (H) 09/29/2017 0841   AST 17 09/29/2017 0841   ALT 22 09/29/2017 0841   BILITOT 0.6 09/29/2017 0841   GFRNONAA 90 09/29/2017 0841   GFRAA 104 09/29/2017 0841     April 25, 2018 thyroid uptake and scan: 57.7% uniform uptake in 24 hours consistent with Graves' disease.  Recent Results (from the past 2160 hour(s))  PAP,TP IMGw/HPV RNA,rflx [id number]     Status: None   Collection Time: 12/25/20  3:27 PM  Result Value Ref Range   Clinical Information:      Comment: None given   LMP:      Comment: NONE GIVEN    PREV. PAP:      Comment: NONE GIVEN   PREV. BX:      Comment: NONE GIVEN   HPV DNA Probe-Source      Comment: None given   STATEMENT OF ADEQUACY:      Comment: Satisfactory for evaluation. Endocervical/transformation zone component absent. Age and/or menstrual status not provided    INTERPRETATION/RESULT:      Comment: Negative for intraepithelial lesion or malignancy.   Comment:      Comment: This Pap test has been evaluated with computer assisted technology.    CYTOTECHNOLOGIST:      Comment: SMP, CT(ASCP) Screening location: [place] [address]    HPV DNA High Risk Not Detected Not Detected    Comment: Methodology: Transcription-Mediated Amplification . This assay detects E6/E7 viral messenger RNA (mRNA) from 14 high-risk HPV types (16,18,31,33,35,39,45,51,52,56,58,59,66,68). . . Cervical sources are required for HPV testing. If a vaginal source from a patient who has had a total hysterectomy with removal of cervix was  submitted, please contact the testing laboratory for alternative testing options. . For additional information, please refer to http://education.questdiagnostics.com/faq/FAQ129v1 (This link if provided for information/ educational purposes only.) EXPLANATORY NOTE:  . The Pap is a screening test for cervical cancer. It is  not a diagnostic test and is subject to false negative  and false positive results. It is most reliable when a  satisfactory sample, regularly obtained, is submitted  with relevant clinical findings and history, and when  the Pap result is evaluated along with historic and  current clinical information. .   TSH     Status: Abnormal   Collection Time: 12/29/20  8:41 AM  Result Value Ref Range   TSH 0.03 (L) mIU/L    Comment:           Reference Range .           > or = 20 Years  0.40-4.50 .                Pregnancy Ranges           First trimester    0.26-2.66           Second trimester    0.55-2.73           Third trimester    0.43-2.91   T4, free  Status: None   Collection Time: 12/29/20  8:41 AM  Result Value Ref Range   Free T4 1.4 0.8 - 1.8 ng/dL  Cologuard     Status: None   Collection Time: 12/31/20  4:15 PM  Result Value Ref Range   COLOGUARD Negative Negative    Comment:  NEGATIVE TEST RESULT. A negative Cologuard result indicates a low likelihood that a colorectal cancer (CRC) or advanced adenoma (adenomatous polyps with more advanced pre-malignant features)  is present. The chance that a person with a negative Cologuard test has a colorectal cancer is less than 1 in 1500 (negative predictive value >99.9%) or has an  advanced adenoma is less than  5.3% (negative predictive value 94.7%). These data are based on a prospective cross-sectional study of 10,000 individuals at average risk for colorectal cancer who were screened with both Cologuard and colonoscopy. (Imperiale T. et al, N Engl J Med 2014;370(14):1286-1297) The normal value (reference range) for this assay is negative.  COLOGUARD RE-SCREENING RECOMMENDATION: Periodic colorectal cancer screening is an important part of preventive healthcare for asymptomatic individuals at average risk for colorectal cancer.  Following a negative Cologuard result, the [place] Task Force screening guidelines recommend a Cologuard re-screening interval of 3 years.  References: American Cancer Society Guideline for Colorectal Cancer Screening: https://www.cancer.org/cancer/colon-rectal-cancer/detection-diagnosis-staging/acs-recommendations.html.; Rex DK, Boland CR, Dominitz JK, Colorectal Cancer Screening: Recommendations for Physicians and Patients from the [place] Task Force on Colorectal Cancer Screening , Am J Gastroenterology 2017; I078015.  TEST DESCRIPTION: Composite algorithmic analysis of stool DNA-biomarkers with hemoglobin immunoassay.   Quantitative values of  individual biomarkers are not reportable and are not associated with individual biomarker result reference ranges. Cologuard is intended for colorectal cancer screening of adults of either sex, 33 years or older, who are at average-risk for colorectal cancer (CRC). Cologuard has been approved for use by the U.S. FDA. The performance of Cologuard was  established in a cross sectional study of average-risk adults aged 48-84. Cologuard performance in patients ages 59 to 76 years was estimated by sub-group analysis of near-age groups. Colonoscopies performed for a positive result may find as the most clinically significant lesion: colorectal cancer [4.0%], advanced adenoma (including sessile serrated polyps greater than or equal to 1cm diameter) [20%] or non- advanced adenoma [31%]; or no colorectal neoplasia [45%]. These estimates are derived from a prospective cross-sectional screening study of 10,000 individuals at average risk for colorectal cancer who were screened with both Cologuard and colonoscopy. (Imperiale T. et al, Alison Stalling J Med 2014;370(14):1286-1297.) Cologuard may produce a false negative or false positive result (no colorectal cancer or precancerous polyp present at colonoscopy follow up). A negative Cologuard test result does not guarantee the absence of CRC or advanced adenoma (pre-cancer). The current Cologuard  screening interval is every 3 years. Paramedic and [employer] developer). Cologuard performance data in a 10,000 patient pivotal study using colonoscopy as the reference method can be accessed at the following location: www.exactlabs.com/results. Additional description of the Cologuard test process, warnings and precautions can be found at www.cologuard.com.   TSH     Status: None   Collection Time: 02/26/21  9:19 AM  Result Value Ref Range   TSH 1.22 mIU/L    Comment:           Reference Range .           > or = 20 Years  0.40-4.50 .  Pregnancy  Ranges           First trimester    0.26-2.66           Second trimester   0.55-2.73           Third trimester    0.43-2.91   T4, Free     Status: None   Collection Time: 02/26/21  9:19 AM  Result Value Ref Range   Free T4 1.1 0.8 - 1.8 ng/dL  T3, Free     Status: None   Collection Time: 02/26/21  9:19 AM  Result Value Ref Range   T3, Free 2.5 2.3 - 4.2 pg/mL     Assessment & Plan:   1.  RAI induced hypothyroidism  2.  Graves' disease-history of   She is status post I-131 thyroid ablation for Graves' disease on Jul 14, 2018. Her most recent thyroid function tests are consistent with euthyroid state. She is advised to continue  levothyroxine  25 mcg p.o. daily before breakfast.     There is some possibility of treatment failure, however she will benefit from staying on a lower dose of levothyroxine.    - We discussed about the correct intake of her thyroid hormone, on empty stomach at fasting, with water, separated by at least 30 minutes from breakfast and other medications,  and separated by more than 4 hours from calcium, iron, multivitamins, acid reflux medications (PPIs). -Patient is made aware of the fact that thyroid hormone replacement is needed for life, dose to be adjusted by periodic monitoring of thyroid function tests.  Regarding her weight concern:  - she acknowledges that there is a room for improvement in her food and drink choices. - Suggestion is made for her to avoid simple carbohydrates  from her diet including Cakes, Sweet Desserts, Ice Cream, Soda (diet and regular), Sweet Tea, Candies, Chips, Cookies, Store Bought Juices, Alcohol in Excess of  1-2 drinks a day, Artificial Sweeteners,  Coffee Creamer, and "Sugar-free" Products, Lemonade. This will help patient to have more stable blood glucose profile and potentially avoid unintended weight gain.  - I advised patient to maintain close follow up with Donita Brooks, MD for primary care needs.   I spent 21  minutes in the care of the patient today including review of labs from Thyroid Function, CMP, and other relevant labs ; imaging/biopsy records (current and previous including abstractions from other facilities); face-to-face time discussing  her lab results and symptoms, medications doses, her options of short and long term treatment based on the latest standards of care / guidelines;   and documenting the encounter.  Jettie Booze Lablanc  participated in the discussions, expressed understanding, and voiced agreement with the above plans.  All questions were answered to her satisfaction. she is encouraged to contact clinic should she have any questions or concerns prior to her return visit.    Follow up plan: Return in about 3 months (around 05/31/2021) for F/U with Pre-visit Labs.   Marquis Lunch, MD [facility] [address] [place], [place] [address] Phone: [phone]  Fax: [phone]     03/02/2021, 5:58 PM  This note was partially dictated with voice recognition software. Similar sounding words can be transcribed inadequately or may not  be corrected upon review.

## 2021-03-02 NOTE — Patient Instructions (Signed)

## 2021-03-15 DIAGNOSIS — Z419 Encounter for procedure for purposes other than remedying health state, unspecified: Secondary | ICD-10-CM | POA: Diagnosis not present

## 2021-04-15 DIAGNOSIS — Z419 Encounter for procedure for purposes other than remedying health state, unspecified: Secondary | ICD-10-CM | POA: Diagnosis not present

## 2021-04-30 ENCOUNTER — Ambulatory Visit (INDEPENDENT_AMBULATORY_CARE_PROVIDER_SITE_OTHER): Payer: Medicaid Other | Admitting: Family Medicine

## 2021-04-30 ENCOUNTER — Encounter: Payer: Self-pay | Admitting: Family Medicine

## 2021-04-30 ENCOUNTER — Other Ambulatory Visit: Payer: Self-pay

## 2021-04-30 VITALS — BP 138/98 | HR 92 | Temp 97.7°F | Resp 18 | Ht 69.0 in | Wt 249.0 lb

## 2021-04-30 DIAGNOSIS — K047 Periapical abscess without sinus: Secondary | ICD-10-CM

## 2021-04-30 MED ORDER — FLUCONAZOLE 150 MG PO TABS: 150.0000 mg | ORAL_TABLET | Freq: Once | ORAL | 0 refills | Status: AC

## 2021-04-30 MED ORDER — AMOXICILLIN-POT CLAVULANATE 875-125 MG PO TABS
1.0000 | ORAL_TABLET | Freq: Two times a day (BID) | ORAL | 0 refills | Status: DC
Start: 2021-04-30 — End: 2021-05-27

## 2021-04-30 NOTE — Progress Notes (Signed)
° °  Subjective:    Patient ID: Cheyenne Campbell, female    DOB: September 04, 1970, 51 y.o.   MRN: [id number]  HPI Patient reports pain and swelling in her right lower jaw for several days.  She has an abscessed tooth.  Her gum is swollen and tender and painful to the touch near her first molar.  She has poor dentition.  She also has tender lymphadenopathy and swelling near the submandibular gland.  She denies any fever or chills.  She denies any shortness of breath or trouble swallowing or trouble eating.  It does hurt to chew.    Past Medical History:  Diagnosis Date   Thyroid disease   History reviewed. No pertinent surgical history. Current Outpatient Medications on File Prior to Visit  Medication Sig Dispense Refill   levothyroxine (SYNTHROID) 25 MCG tablet Take 1 tablet (25 mcg total) by mouth daily before breakfast. 90 tablet 1   olopatadine (PATANOL) 0.1 % ophthalmic solution Place 1 drop into both eyes 2 (two) times daily. 5 mL 3   Polysorbate 80, PF, (VIVA DROPS) 1 % SOLN Apply 1-2 drops to eye 4 (four) times daily as needed (each eye). 10 mL 1   Multiple Vitamin (MULTIVITAMIN ADULT PO) Take 1 tablet by mouth daily in the afternoon. (Patient not taking: Reported on 04/30/2021)     No current facility-administered medications on file prior to visit.   No Known Allergies Social History   Socioeconomic History   Marital status: Single    Spouse name: Not on file   Number of children: Not on file   Years of education: Not on file   Highest education level: Not on file  Occupational History   Not on file  Tobacco Use   Smoking status: Never   Smokeless tobacco: Never  Vaping Use   Vaping Use: Never used  Substance and Sexual Activity   Alcohol use: No   Drug use: No   Sexual activity: Not on file  Other Topics Concern   Not on file  Social History Narrative   Not on file   Social Determinants of Health   Financial Resource Strain: Not on file  Food Insecurity: Not on file   Transportation Needs: Not on file  Physical Activity: Not on file  Stress: Not on file  Social Connections: Not on file  Intimate Partner Violence: Not on file    Review of Systems  All other systems reviewed and are negative.     Objective:   Physical Exam Constitutional:      General: She is not in acute distress.    Appearance: Normal appearance. She is obese. She is not ill-appearing or toxic-appearing.  HENT:     Mouth/Throat:     Dentition: Abnormal dentition. Dental tenderness and dental abscesses present.   Cardiovascular:     Rate and Rhythm: Normal rate and regular rhythm.     Heart sounds: Normal heart sounds.  Pulmonary:     Effort: Pulmonary effort is normal.     Breath sounds: Normal breath sounds.  Neurological:     Mental Status: She is alert.          Assessment & Plan:  Abscessed tooth Begin Augmentin 875 mg twice daily for 10 days.  Use ibuprofen for swelling and also use warm compresses.  Gave the patient contact information to find a dentist who accepts her insurance.

## 2021-05-13 DIAGNOSIS — Z419 Encounter for procedure for purposes other than remedying health state, unspecified: Secondary | ICD-10-CM | POA: Diagnosis not present

## 2021-05-22 ENCOUNTER — Other Ambulatory Visit: Payer: Self-pay | Admitting: "Endocrinology

## 2021-05-22 DIAGNOSIS — E890 Postprocedural hypothyroidism: Secondary | ICD-10-CM | POA: Diagnosis not present

## 2021-05-23 LAB — T3, FREE: T3, Free: 3 pg/mL (ref 2.3–4.2)

## 2021-05-23 LAB — TSH: TSH: 1.36 mIU/L

## 2021-05-23 LAB — T4, FREE: Free T4: 1 ng/dL (ref 0.8–1.8)

## 2021-05-27 ENCOUNTER — Encounter: Payer: Self-pay | Admitting: "Endocrinology

## 2021-05-27 ENCOUNTER — Ambulatory Visit (INDEPENDENT_AMBULATORY_CARE_PROVIDER_SITE_OTHER): Payer: Medicaid Other | Admitting: "Endocrinology

## 2021-05-27 VITALS — BP 140/92 | HR 80 | Ht 69.0 in | Wt 255.2 lb

## 2021-05-27 DIAGNOSIS — E890 Postprocedural hypothyroidism: Secondary | ICD-10-CM

## 2021-05-27 NOTE — Progress Notes (Signed)
? ?     ?                                           05/27/2021, 5:07 PM ?    ? ?    ?       ?Endocrinology follow-up note ? ? ?Subjective:  ? ? Patient ID: Cheyenne Campbell, female    DOB: 1970/07/20, PCP Donita Brooks, MD ? ? ?Past Medical History:  ?Diagnosis Date  ? Thyroid disease   ? ?History reviewed. No pertinent surgical history. ?Social History  ? ?Socioeconomic History  ? Marital status: Single  ?  Spouse name: Not on file  ? Number of children: Not on file  ? Years of education: Not on file  ? Highest education level: Not on file  ?Occupational History  ? Not on file  ?Tobacco Use  ? Smoking status: Never  ? Smokeless tobacco: Never  ?Vaping Use  ? Vaping Use: Never used  ?Substance and Sexual Activity  ? Alcohol use: No  ? Drug use: No  ? Sexual activity: Not on file  ?Other Topics Concern  ? Not on file  ?Social History Narrative  ? Not on file  ? ?Social Determinants of Health  ? ?Financial Resource Strain: Not on file  ?Food Insecurity: Not on file  ?Transportation Needs: Not on file  ?Physical Activity: Not on file  ?Stress: Not on file  ?Social Connections: Not on file  ? ?Outpatient Encounter Medications as of 05/27/2021  ?Medication Sig  ? levothyroxine (SYNTHROID) 25 MCG tablet Take 1 tablet (25 mcg total) by mouth daily before breakfast.  ? olopatadine (PATANOL) 0.1 % ophthalmic solution Place 1 drop into both eyes 2 (two) times daily.  ? Polysorbate 80, PF, (VIVA DROPS) 1 % SOLN Apply 1-2 drops to eye 4 (four) times daily as needed (each eye).  ? [DISCONTINUED] amoxicillin-clavulanate (AUGMENTIN) 875-125 MG tablet Take 1 tablet by mouth 2 (two) times daily.  ? [DISCONTINUED] Multiple Vitamin (MULTIVITAMIN ADULT PO) Take 1 tablet by mouth daily in the afternoon. (Patient not taking: Reported on 04/30/2021)  ? ?No facility-administered encounter medications on file as of 05/27/2021.  ? ?ALLERGIES: ?No Known Allergies ? ?VACCINATION STATUS: ?Immunization History  ?Administered Date(s)  Administered  ? Influenza Split 11/01/2012  ? Influenza,inj,Quad PF,6+ Mos 12/25/2020  ? PFIZER Comirnaty(Gray Top)Covid-19 Tri-Sucrose Vaccine 05/03/2020  ? PFIZER(Purple Top)SARS-COV-2 Vaccination 10/10/2019, 10/31/2019  ? Tdap 09/29/2017  ? ? ?HPI ?Cheyenne Campbell is 51 y.o. female who is being seen in follow-up for  RAI induced hypothyroidism.   She was given I-131 thyroid ablation on Jul 14, 2018. ?She was started on Levothyroxine at higher doses but Her TFTS did not tolerate. ?She is currently on levothyroxine 25 mcg p.o. daily before breakfast. Her previsit labs are  consistent with appropriate replacement. ? ? She has no concerning symptoms at this time.  She denies palpitations, tremors, heat intolerance.   ? ?-She has history of goiter. ?She has unidentified thyroid dysfunction in 1 of her sisters. ? ?Review of Systems ? ?Limited as above. ? ?Objective:  ?  ?BP (!) 140/92   Pulse 80   Ht 5\' 9"  (1.753 m)   Wt 255 lb 3.2 oz (115.8 kg)   BMI 37.69 kg/m?   ?Wt Readings from Last 3 Encounters:  ?05/27/21 255 lb 3.2 oz (115.8 kg)  ?04/30/21 249  lb (112.9 kg)  ?03/02/21 250 lb (113.4 kg)  ?  ?Physical Exam ? ? ?CMP ( most recent) ?CMP  ?   ?Component Value Date/Time  ? NA 139 09/29/2017 0841  ? K 3.9 09/29/2017 0841  ? CL 101 09/29/2017 0841  ? CO2 31 09/29/2017 0841  ? GLUCOSE 85 09/29/2017 0841  ? BUN 8 09/29/2017 0841  ? CREATININE 0.78 09/29/2017 0841  ? CALCIUM 9.5 09/29/2017 0841  ? PROT 8.3 (H) 09/29/2017 0841  ? AST 17 09/29/2017 0841  ? ALT 22 09/29/2017 0841  ? BILITOT 0.6 09/29/2017 0841  ? GFRNONAA 90 09/29/2017 0841  ? GFRAA 104 09/29/2017 0841  ? ? ? ?April 25, 2018 thyroid uptake and scan: ?57.7% uniform uptake in 24 hours consistent with Graves' disease. ? ?Recent Results (from the past 2160 hour(s))  ?TSH     Status: None  ? Collection Time: 05/22/21  8:55 AM  ?Result Value Ref Range  ? TSH 1.36 mIU/L  ?  Comment:           Reference Range ?. ?          > or = 20 Years  0.40-4.50 ?. ?                Pregnancy Ranges ?          First trimester    0.26-2.66 ?          Second trimester   0.55-2.73 ?          Third trimester    0.43-2.91 ?  ?T4, free     Status: None  ? Collection Time: 05/22/21  8:55 AM  ?Result Value Ref Range  ? Free T4 1.0 0.8 - 1.8 ng/dL  ?T3, free     Status: None  ? Collection Time: 05/22/21  8:55 AM  ?Result Value Ref Range  ? T3, Free 3.0 2.3 - 4.2 pg/mL  ? ? ? ?Assessment & Plan:  ? ?1.  RAI induced hypothyroidism  ?2.  Graves' disease-history of  ? ?She is status post I-131 thyroid ablation for Graves' disease on Jul 14, 2018. ?Her most recent thyroid function tests are consistent with appropriate replacement with low dose of Levothyroxine . She is advsed to continue Levo at 25 mcg po qam. ? ?There is some possibility of treatment failure, however she will benefit from staying on a lower dose of levothyroxine.  ? ? - We discussed about the correct intake of her thyroid hormone, on empty stomach at fasting, with water, separated by at least 30 minutes from breakfast and other medications,  and separated by more than 4 hours from calcium, iron, multivitamins, acid reflux medications (PPIs). ?-Patient is made aware of the fact that thyroid hormone replacement is needed for life, dose to be adjusted by periodic monitoring of thyroid function tests. ? ? ?Regarding her weight concern: ? ?- she acknowledges that there is a room for improvement in her food and drink choices. ?- Suggestion is made for her to avoid simple carbohydrates  from her diet including Cakes, Sweet Desserts, Ice Cream, Soda (diet and regular), Sweet Tea, Candies, Chips, Cookies, Store Bought Juices, Alcohol in Excess of  1-2 drinks a day, Artificial Sweeteners,  Coffee Creamer, and "Sugar-free" Products, Lemonade. This will help patient to have more stable blood glucose profile and potentially avoid unintended weight gain. ?- I advised patient to maintain close follow up with Donita Brooks, MD for primary care  needs. ? ? ?  I spent 21 minutes in the care of the patient today including review of labs from Thyroid Function, CMP, and other relevant labs ; imaging/biopsy records (current and previous including abstractions from other facilities); face-to-face time discussing  her lab results and symptoms, medications doses, her options of short and long term treatment based on the latest standards of care / guidelines;   and documenting the encounter. ? ?Cheyenne Campbell  participated in the discussions, expressed understanding, and voiced agreement with the above plans.  All questions were answered to her satisfaction. she is encouraged to contact clinic should she have any questions or concerns prior to her return visit. ? ? ?Follow up plan: ?Return in about 4 months (around 09/26/2021) for F/U with Pre-visit Labs. ? ? ?Marquis LunchGebre Talen Poser, MD ?[facility] ?[facility] ?[address] ?[place], [place] [address] ?Phone: [phone]  Fax: [phone]   ? ? ?05/27/2021, 5:07 PM ? ?This note was partially dictated with voice recognition software. Similar sounding words can be transcribed inadequately or may not  be corrected upon review. ? ?

## 2021-06-13 DIAGNOSIS — Z419 Encounter for procedure for purposes other than remedying health state, unspecified: Secondary | ICD-10-CM | POA: Diagnosis not present

## 2021-07-13 DIAGNOSIS — Z419 Encounter for procedure for purposes other than remedying health state, unspecified: Secondary | ICD-10-CM | POA: Diagnosis not present

## 2021-08-13 DIAGNOSIS — Z419 Encounter for procedure for purposes other than remedying health state, unspecified: Secondary | ICD-10-CM | POA: Diagnosis not present

## 2021-09-12 DIAGNOSIS — Z419 Encounter for procedure for purposes other than remedying health state, unspecified: Secondary | ICD-10-CM | POA: Diagnosis not present

## 2021-09-25 ENCOUNTER — Other Ambulatory Visit: Payer: Self-pay | Admitting: "Endocrinology

## 2021-09-25 DIAGNOSIS — E890 Postprocedural hypothyroidism: Secondary | ICD-10-CM | POA: Diagnosis not present

## 2021-09-26 LAB — T3, FREE: T3, Free: 2.5 pg/mL (ref 2.3–4.2)

## 2021-09-26 LAB — T4, FREE: Free T4: 1 ng/dL (ref 0.8–1.8)

## 2021-09-26 LAB — TSH: TSH: 1.85 mIU/L

## 2021-10-01 ENCOUNTER — Ambulatory Visit (INDEPENDENT_AMBULATORY_CARE_PROVIDER_SITE_OTHER): Payer: Medicaid Other | Admitting: "Endocrinology

## 2021-10-01 ENCOUNTER — Encounter: Payer: Self-pay | Admitting: "Endocrinology

## 2021-10-01 VITALS — BP 142/102 | HR 76 | Ht 69.0 in | Wt 258.0 lb

## 2021-10-01 DIAGNOSIS — E890 Postprocedural hypothyroidism: Secondary | ICD-10-CM | POA: Diagnosis not present

## 2021-10-01 DIAGNOSIS — I10 Essential (primary) hypertension: Secondary | ICD-10-CM

## 2021-10-01 DIAGNOSIS — Z6838 Body mass index (BMI) 38.0-38.9, adult: Secondary | ICD-10-CM | POA: Diagnosis not present

## 2021-10-01 MED ORDER — LISINOPRIL-HYDROCHLOROTHIAZIDE 10-12.5 MG PO TABS: 1.0000 | ORAL_TABLET | Freq: Every day | ORAL | 1 refills | Status: DC

## 2021-10-01 NOTE — Patient Instructions (Signed)

## 2021-10-01 NOTE — Progress Notes (Signed)
10/01/2021, 10:21 AM                 Endocrinology follow-up note   Subjective:    Patient ID: Cheyenne Campbell, female    DOB: 1970/11/15, PCP Donita Brooks, MD   Past Medical History:  Diagnosis Date   Thyroid disease    History reviewed. No pertinent surgical history. Social History   Socioeconomic History   Marital status: Single    Spouse name: Not on file   Number of children: Not on file   Years of education: Not on file   Highest education level: Not on file  Occupational History   Not on file  Tobacco Use   Smoking status: Never   Smokeless tobacco: Never  Vaping Use   Vaping Use: Never used  Substance and Sexual Activity   Alcohol use: No   Drug use: No   Sexual activity: Not on file  Other Topics Concern   Not on file  Social History Narrative   Not on file   Social Determinants of Health   Financial Resource Strain: Not on file  Food Insecurity: Not on file  Transportation Needs: Not on file  Physical Activity: Not on file  Stress: Not on file  Social Connections: Not on file   Outpatient Encounter Medications as of 10/01/2021  Medication Sig   lisinopril-hydrochlorothiazide (ZESTORETIC) 10-12.5 MG tablet Take 1 tablet by mouth daily.   levothyroxine (SYNTHROID) 25 MCG tablet Take 1 tablet (25 mcg total) by mouth daily before breakfast.   olopatadine (PATANOL) 0.1 % ophthalmic solution Place 1 drop into both eyes 2 (two) times daily.   Polysorbate 80, PF, (VIVA DROPS) 1 % SOLN Apply 1-2 drops to eye 4 (four) times daily as needed (each eye).   No facility-administered encounter medications on file as of 10/01/2021.   ALLERGIES: No Known Allergies  VACCINATION STATUS: Immunization History  Administered Date(s) Administered   Influenza Split 11/01/2012   Influenza,inj,Quad PF,6+ Mos 12/25/2020   PFIZER Comirnaty(Gray Top)Covid-19 Tri-Sucrose Vaccine 05/03/2020   PFIZER(Purple  Top)SARS-COV-2 Vaccination 10/10/2019, 10/31/2019   Tdap 09/29/2017    HPI Cheyenne Campbell is 51 y.o. female who is being seen in follow-up for  RAI induced hypothyroidism.   She was given I-131 thyroid ablation on Jul 14, 2018. She was started on Levothyroxine at higher doses but Her TFTS did not tolerate. She is currently on levothyroxine 25 mcg p.o. daily before breakfast.  Her previsit labs are consistent with appropriate replacement.     She has no concerning symptoms at this time.  She denies palpitations, tremors, heat intolerance.  She presents with significant weight gain, admits for dietary indiscretion.  -She has history of goiter. She has unidentified thyroid dysfunction in 1 of her sisters. She also has hypertension, has not been taking blood pressure medications for the last several months.  Review of Systems  Limited as above.  Objective:    BP (!) 142/102   Pulse 76   Ht 5\' 9"  (1.753 m)   Wt 258 lb (117 kg)   BMI 38.10 kg/m   Wt Readings from Last 3 Encounters:  10/01/21 258 lb (117 kg)  05/27/21 255 lb 3.2 oz (115.8  kg)  04/30/21 249 lb (112.9 kg)    Physical Exam   CMP ( most recent) CMP     Component Value Date/Time   NA 139 09/29/2017 0841   K 3.9 09/29/2017 0841   CL 101 09/29/2017 0841   CO2 31 09/29/2017 0841   GLUCOSE 85 09/29/2017 0841   BUN 8 09/29/2017 0841   CREATININE 0.78 09/29/2017 0841   CALCIUM 9.5 09/29/2017 0841   PROT 8.3 (H) 09/29/2017 0841   AST 17 09/29/2017 0841   ALT 22 09/29/2017 0841   BILITOT 0.6 09/29/2017 0841   GFRNONAA 90 09/29/2017 0841   GFRAA 104 09/29/2017 0841     April 25, 2018 thyroid uptake and scan: 57.7% uniform uptake in 24 hours consistent with Graves' disease.  Recent Results (from the past 2160 hour(s))  TSH     Status: None   Collection Time: 09/25/21  4:47 PM  Result Value Ref Range   TSH 1.85 mIU/L    Comment:           Reference Range .           > or = 20 Years  0.40-4.50 .                 Pregnancy Ranges           First trimester    0.26-2.66           Second trimester   0.55-2.73           Third trimester    0.43-2.91   T4, free     Status: None   Collection Time: 09/25/21  4:47 PM  Result Value Ref Range   Free T4 1.0 0.8 - 1.8 ng/dL  T3, free     Status: None   Collection Time: 09/25/21  4:47 PM  Result Value Ref Range   T3, Free 2.5 2.3 - 4.2 pg/mL     Assessment & Plan:   1.  RAI induced hypothyroidism  2.  Graves' disease-history of   She is status post I-131 thyroid ablation for Graves' disease on Jul 14, 2018. Her most recent thyroid function tests are consistent with appropriate replacement with low-dose levothyroxine.  She is advised to continue levothyroxine 25 mcg p.o. daily before breakfast.     - We discussed about the correct intake of her thyroid hormone, on empty stomach at fasting, with water, separated by at least 30 minutes from breakfast and other medications,  and separated by more than 4 hours from calcium, iron, multivitamins, acid reflux medications (PPIs). -Patient is made aware of the fact that thyroid hormone replacement is needed for life, dose to be adjusted by periodic monitoring of thyroid function tests.  For her hypertension: I discussed and initiated lisinopril/HCTZ 10/12.5 mg p.o. daily at breakfast.   Regarding her weight concern:  - she acknowledges that there is a room for improvement in her food and drink choices. - Suggestion is made for her to avoid simple carbohydrates  from her diet including Cakes, Sweet Desserts, Ice Cream, Soda (diet and regular), Sweet Tea, Candies, Chips, Cookies, Store Bought Juices, Alcohol , Artificial Sweeteners,  Coffee Creamer, and "Sugar-free" Products, Lemonade. This will help patient to have more stable blood glucose profile and potentially avoid unintended weight gain.  The following Lifestyle Medicine recommendations according to [employer]estyle [employer]  [facility]) were  discussed and and offered to patient and she  agrees to start the journey:  A. Whole  Foods, Plant-Based Nutrition comprising of fruits and vegetables, plant-based proteins, whole-grain carbohydrates was discussed in detail with the patient.   A list for source of those nutrients were also provided to the patient.  Patient will use only water or unsweetened tea for hydration. B.  The need to stay away from risky substances including alcohol, smoking; obtaining 7 to 9 hours of restorative sleep, at least 150 minutes of moderate intensity exercise weekly, the importance of healthy social connections,  and stress management techniques were discussed. C.  A full color page of  Calorie density of various food groups per pound showing examples of each food groups was provided to the patient.  She is advised to maintain close follow-up with her PMD.    I spent 31 minutes in the care of the patient today including review of labs from Thyroid Function, CMP, and other relevant labs ; imaging/biopsy records (current and previous including abstractions from other facilities); face-to-face time discussing  her lab results and symptoms, medications doses, her options of short and long term treatment based on the latest standards of care / guidelines;   and documenting the encounter.  Jettie Booze Fuente  participated in the discussions, expressed understanding, and voiced agreement with the above plans.  All questions were answered to her satisfaction. she is encouraged to contact clinic should she have any questions or concerns prior to her return visit.    Follow up plan: Return in about 3 months (around 01/01/2022) for F/U with Pre-visit Labs.   Marquis Lunch, MD [facility] [address] [place], [place] [address] Phone: [phone]  Fax: [phone]     10/01/2021, 10:21 AM  This note was partially dictated with voice recognition software. Similar  sounding words can be transcribed inadequately or may not  be corrected upon review.

## 2021-10-08 ENCOUNTER — Other Ambulatory Visit: Payer: Self-pay | Admitting: "Endocrinology

## 2021-10-08 ENCOUNTER — Telehealth: Payer: Self-pay | Admitting: *Deleted

## 2021-10-08 DIAGNOSIS — T783XXA Angioneurotic edema, initial encounter: Secondary | ICD-10-CM | POA: Diagnosis not present

## 2021-10-08 MED ORDER — AMLODIPINE BESYLATE 10 MG PO TABS: 10.0000 mg | ORAL_TABLET | Freq: Every day | ORAL | 1 refills | Status: DC

## 2021-10-08 NOTE — Telephone Encounter (Signed)
Pt states she had a reaction to the lisinopril HCTZ.

## 2021-10-08 NOTE — Telephone Encounter (Signed)
Patient was given bp medication and had allergic reaction her lip was swollen [facility] [address] [facility] and wanted to see if dr nida would change the medication please advise

## 2021-10-08 NOTE — Telephone Encounter (Signed)
Spoke with pt, made her aware. Understanding voiced.

## 2021-10-09 ENCOUNTER — Telehealth: Payer: Self-pay

## 2021-10-09 NOTE — Telephone Encounter (Signed)
Pt called re: amlodipine 10mg  which was Rx per her thyroid Dr., pt was not sure if she should take it? Advice pt to call her thyroid dr. To express her concerns. Pt voiced understanding and will call her thyroid Dr.

## 2021-10-13 DIAGNOSIS — Z419 Encounter for procedure for purposes other than remedying health state, unspecified: Secondary | ICD-10-CM | POA: Diagnosis not present

## 2021-11-13 DIAGNOSIS — Z419 Encounter for procedure for purposes other than remedying health state, unspecified: Secondary | ICD-10-CM | POA: Diagnosis not present

## 2021-11-17 ENCOUNTER — Other Ambulatory Visit: Payer: Self-pay | Admitting: "Endocrinology

## 2021-12-13 DIAGNOSIS — Z419 Encounter for procedure for purposes other than remedying health state, unspecified: Secondary | ICD-10-CM | POA: Diagnosis not present

## 2021-12-22 ENCOUNTER — Encounter: Payer: Self-pay | Admitting: Family Medicine

## 2021-12-29 ENCOUNTER — Other Ambulatory Visit: Payer: Self-pay | Admitting: "Endocrinology

## 2021-12-29 DIAGNOSIS — I10 Essential (primary) hypertension: Secondary | ICD-10-CM | POA: Diagnosis not present

## 2021-12-29 DIAGNOSIS — E890 Postprocedural hypothyroidism: Secondary | ICD-10-CM | POA: Diagnosis not present

## 2021-12-30 LAB — COMPLETE METABOLIC PANEL WITH GFR
AG Ratio: 1.1 (calc) (ref 1.0–2.5)
ALT: 38 U/L — ABNORMAL HIGH (ref 6–29)
AST: 20 U/L (ref 10–35)
Albumin: 4.2 g/dL (ref 3.6–5.1)
Alkaline phosphatase (APISO): 113 U/L (ref 37–153)
BUN: 11 mg/dL (ref 7–25)
CO2: 32 mmol/L (ref 20–32)
Calcium: 9.5 mg/dL (ref 8.6–10.4)
Chloride: 102 mmol/L (ref 98–110)
Creat: 0.76 mg/dL (ref 0.50–1.03)
Globulin: 4 g/dL (calc) — ABNORMAL HIGH (ref 1.9–3.7)
Glucose, Bld: 80 mg/dL (ref 65–99)
Potassium: 4 mmol/L (ref 3.5–5.3)
Sodium: 141 mmol/L (ref 135–146)
Total Bilirubin: 0.6 mg/dL (ref 0.2–1.2)
Total Protein: 8.2 g/dL — ABNORMAL HIGH (ref 6.1–8.1)
eGFR: 94 mL/min/{1.73_m2} (ref >=60)

## 2021-12-30 LAB — LIPID PANEL
Cholesterol: 150 mg/dL (ref <200)
HDL: 60 mg/dL (ref >=50)
LDL Cholesterol (Calc): 76 mg/dL (calc)
Non-HDL Cholesterol (Calc): 90 mg/dL (calc) (ref <130)
Total CHOL/HDL Ratio: 2.5 (calc) (ref <5.0)
Triglycerides: 64 mg/dL (ref <150)

## 2021-12-30 LAB — T4, FREE: Free T4: 1 ng/dL (ref 0.8–1.8)

## 2021-12-30 LAB — TSH: TSH: 4.85 mIU/L — ABNORMAL HIGH

## 2022-01-01 ENCOUNTER — Ambulatory Visit (INDEPENDENT_AMBULATORY_CARE_PROVIDER_SITE_OTHER): Payer: Medicaid Other | Admitting: "Endocrinology

## 2022-01-01 ENCOUNTER — Encounter: Payer: Self-pay | Admitting: "Endocrinology

## 2022-01-01 VITALS — BP 136/94 | HR 84 | Ht 69.0 in | Wt 259.4 lb

## 2022-01-01 DIAGNOSIS — Z6838 Body mass index (BMI) 38.0-38.9, adult: Secondary | ICD-10-CM

## 2022-01-01 DIAGNOSIS — E890 Postprocedural hypothyroidism: Secondary | ICD-10-CM

## 2022-01-01 DIAGNOSIS — I10 Essential (primary) hypertension: Secondary | ICD-10-CM | POA: Diagnosis not present

## 2022-01-01 MED ORDER — LEVOTHYROXINE SODIUM 50 MCG PO TABS: 50.0000 ug | ORAL_TABLET | Freq: Every day | ORAL | 1 refills | Status: DC

## 2022-01-01 NOTE — Patient Instructions (Signed)

## 2022-01-01 NOTE — Progress Notes (Signed)
01/01/2022, 12:23 PM                 Endocrinology follow-up note   Subjective:    Patient ID: Cheyenne Campbell, female    DOB: 1970-03-31, PCP Susy Frizzle, MD   Past Medical History:  Diagnosis Date   Thyroid disease    History reviewed. No pertinent surgical history. Social History   Socioeconomic History   Marital status: Single    Spouse name: Not on file   Number of children: Not on file   Years of education: Not on file   Highest education level: Not on file  Occupational History   Not on file  Tobacco Use   Smoking status: Never   Smokeless tobacco: Never  Vaping Use   Vaping Use: Never used  Substance and Sexual Activity   Alcohol use: No   Drug use: No   Sexual activity: Not on file  Other Topics Concern   Not on file  Social History Narrative   Not on file   Social Determinants of Health   Financial Resource Strain: Not on file  Food Insecurity: Not on file  Transportation Needs: Not on file  Physical Activity: Not on file  Stress: Not on file  Social Connections: Not on file   Outpatient Encounter Medications as of 01/01/2022  Medication Sig   amLODipine (NORVASC) 10 MG tablet Take 1 tablet (10 mg total) by mouth daily.   levothyroxine (SYNTHROID) 50 MCG tablet Take 1 tablet (50 mcg total) by mouth daily before breakfast.   olopatadine (PATANOL) 0.1 % ophthalmic solution Place 1 drop into both eyes 2 (two) times daily.   [DISCONTINUED] levothyroxine (SYNTHROID) 25 MCG tablet TAKE 1 TABLET BY MOUTH DAILY BEFORE BREAKFAST.   [DISCONTINUED] Polysorbate 80, PF, (VIVA DROPS) 1 % SOLN Apply 1-2 drops to eye 4 (four) times daily as needed (each eye).   No facility-administered encounter medications on file as of 01/01/2022.   ALLERGIES: Allergies  Allergen Reactions   Ace Inhibitors Swelling    VACCINATION STATUS: Immunization History  Administered Date(s) Administered   Influenza  Split 11/01/2012   Influenza,inj,Quad PF,6+ Mos 12/25/2020   PFIZER Comirnaty(Gray Top)Covid-19 Tri-Sucrose Vaccine 05/03/2020   PFIZER(Purple Top)SARS-COV-2 Vaccination 10/10/2019, 10/31/2019   Tdap 09/29/2017    HPI Cheyenne Campbell is 51 y.o. female who is being seen in follow-up for  RAI induced hypothyroidism.   She was given I-131 thyroid ablation on Jul 14, 2018. She was started on Levothyroxine at higher doses but Her TFTS did not tolerate. She is currently on levothyroxine 25 mcg p.o. daily before breakfast.  Her previsit labs are consistent with under replacement.    She has no concerning symptoms at this time.  She denies palpitations, tremors, heat intolerance.    -She has history of goiter. She has unidentified thyroid dysfunction in 1 of her sisters. She also has hypertension, has not been taking blood pressure medications for the last several months.  Review of Systems  Limited as above.  Objective:    BP (!) 136/94   Pulse 84   Ht 5' 9"  (1.753 m)   Wt 259 lb 6.4 oz (117.7 kg)   BMI 38.31 kg/m  Wt Readings from Last 3 Encounters:  01/01/22 259 lb 6.4 oz (117.7 kg)  10/01/21 258 lb (117 kg)  05/27/21 255 lb 3.2 oz (115.8 kg)    Physical Exam   CMP ( most recent) CMP     Component Value Date/Time   NA 141 12/29/2021 0802   K 4.0 12/29/2021 0802   CL 102 12/29/2021 0802   CO2 32 12/29/2021 0802   GLUCOSE 80 12/29/2021 0802   BUN 11 12/29/2021 0802   CREATININE 0.76 12/29/2021 0802   CALCIUM 9.5 12/29/2021 0802   PROT 8.2 (H) 12/29/2021 0802   AST 20 12/29/2021 0802   ALT 38 (H) 12/29/2021 0802   BILITOT 0.6 12/29/2021 0802   GFRNONAA 90 09/29/2017 0841   GFRAA 104 09/29/2017 0841     April 25, 2018 thyroid uptake and scan: 57.7% uniform uptake in 24 hours consistent with Graves' disease.  Recent Results (from the past 2160 hour(s))  Lipid panel     Status: None   Collection Time: 12/29/21  8:02 AM  Result Value Ref Range   Cholesterol 150  <200 mg/dL   HDL 60 > OR = 50 mg/dL   Triglycerides 64 <150 mg/dL   LDL Cholesterol (Calc) 76 mg/dL (calc)    Comment: Reference range: <100 . Desirable range <100 mg/dL for primary prevention;   <70 mg/dL for patients with CHD or diabetic patients  with > or = 2 CHD risk factors. Marland Kitchen LDL-C is now calculated using the Martin-Hopkins  calculation, which is a validated novel method providing  better accuracy than the Friedewald equation in the  estimation of LDL-C.  Cresenciano Genre et al. Annamaria Helling. 7414;239(53): 2061-2068  (http://education.[employer].com/faq/FAQ164)    Total CHOL/HDL Ratio 2.5 <5.0 (calc)   Non-HDL Cholesterol (Calc) 90 <130 mg/dL (calc)    Comment: For patients with diabetes plus 1 major ASCVD risk  factor, treating to a non-HDL-C goal of <100 mg/dL  (LDL-C of <70 mg/dL) is considered a therapeutic  option.   COMPLETE METABOLIC PANEL WITH GFR     Status: Abnormal   Collection Time: 12/29/21  8:02 AM  Result Value Ref Range   Glucose, Bld 80 65 - 99 mg/dL    Comment: .            Fasting reference interval .    BUN 11 7 - 25 mg/dL   Creat 0.76 0.50 - 1.03 mg/dL   eGFR 94 > OR = 60 mL/min/1.53m   BUN/Creatinine Ratio SEE NOTE: 6 - 22 (calc)    Comment:    Not Reported: BUN and Creatinine are within    reference range. .    Sodium 141 135 - 146 mmol/L   Potassium 4.0 3.5 - 5.3 mmol/L   Chloride 102 98 - 110 mmol/L   CO2 32 20 - 32 mmol/L   Calcium 9.5 8.6 - 10.4 mg/dL   Total Protein 8.2 (H) 6.1 - 8.1 g/dL   Albumin 4.2 3.6 - 5.1 g/dL   Globulin 4.0 (H) 1.9 - 3.7 g/dL (calc)   AG Ratio 1.1 1.0 - 2.5 (calc)   Total Bilirubin 0.6 0.2 - 1.2 mg/dL   Alkaline phosphatase (APISO) 113 37 - 153 U/L   AST 20 10 - 35 U/L   ALT 38 (H) 6 - 29 U/L  TSH     Status: Abnormal   Collection Time: 12/29/21  8:02 AM  Result Value Ref Range   TSH 4.85 (H) mIU/L    Comment:  Reference Range .           > or = 20 Years  0.40-4.50 .                Pregnancy  Ranges           First trimester    0.26-2.66           Second trimester   0.55-2.73           Third trimester    0.43-2.91   T4, free     Status: None   Collection Time: 12/29/21  8:02 AM  Result Value Ref Range   Free T4 1.0 0.8 - 1.8 ng/dL     Assessment & Plan:   1.  RAI induced hypothyroidism  2.  Graves' disease-history of   She is status post I-131 thyroid ablation for Graves' disease on Jul 14, 2018. Her most recent thyroid function tests are consistent with under replacement.  She will be considered for higher dose of levothyroxine.  I discussed and increase her levothyroxine to 50 mcg p.o. daily before breakfast.     - We discussed about the correct intake of her thyroid hormone, on empty stomach at fasting, with water, separated by at least 30 minutes from breakfast and other medications,  and separated by more than 4 hours from calcium, iron, multivitamins, acid reflux medications (PPIs). -Patient is made aware of the fact that thyroid hormone replacement is needed for life, dose to be adjusted by periodic monitoring of thyroid function tests.   For her hypertension: She did not tolerate lisinopril/HCTZ.  She is currently on amlodipine 10 mg p.o. daily.  She is advised to continue.   Regarding her weight concern: - she acknowledges that there is a room for improvement in her food and drink choices. - Suggestion is made for her to avoid simple carbohydrates  from her diet including Cakes, Sweet Desserts, Ice Cream, Soda (diet and regular), Sweet Tea, Candies, Chips, Cookies, Store Bought Juices, Alcohol , Artificial Sweeteners,  Coffee Creamer, and "Sugar-free" Products, Lemonade. This will help patient to have more stable blood glucose profile and potentially avoid unintended weight gain.  The following Lifestyle Medicine recommendations according to [place]  [facility]) were discussed and and offered to patient and she  agrees to start the journey:   A. Whole Foods, Plant-Based Nutrition comprising of fruits and vegetables, plant-based proteins, whole-grain carbohydrates was discussed in detail with the patient.   A list for source of those nutrients were also provided to the patient.  Patient will use only water or unsweetened tea for hydration. B.  The need to stay away from risky substances including alcohol, smoking; obtaining 7 to 9 hours of restorative sleep, at least 150 minutes of moderate intensity exercise weekly, the importance of healthy social connections,  and stress management techniques were discussed. C.  A full color page of  Calorie density of various food groups per pound showing examples of each food groups was provided to the patient.    She is advised to maintain close follow-up with her PMD.    I spent 31 minutes in the care of the patient today including review of labs from Thyroid Function, CMP, and other relevant labs ; imaging/biopsy records (current and previous including abstractions from other facilities); face-to-face time discussing  her lab results and symptoms, medications doses, her options of short and long term treatment based on the latest standards of care / guidelines;  and documenting the encounter.  Cheyenne Campbell  participated in the discussions, expressed understanding, and voiced agreement with the above plans.  All questions were answered to her satisfaction. she is encouraged to contact clinic should she have any questions or concerns prior to her return visit.   Follow up plan: Return in about 4 months (around 05/04/2022) for F/U with Pre-visit Labs, A1c -NV.   Glade Lloyd, MD [facility] [address] [place], [place] [address] Phone: [phone]  Fax: [phone]     01/01/2022, 12:23 PM  This note was partially dictated with voice recognition software. Similar sounding words can be transcribed inadequately or may not  be  corrected upon review.

## 2022-01-13 DIAGNOSIS — Z419 Encounter for procedure for purposes other than remedying health state, unspecified: Secondary | ICD-10-CM | POA: Diagnosis not present

## 2022-02-12 DIAGNOSIS — Z419 Encounter for procedure for purposes other than remedying health state, unspecified: Secondary | ICD-10-CM | POA: Diagnosis not present

## 2022-02-22 DIAGNOSIS — K1120 Sialoadenitis, unspecified: Secondary | ICD-10-CM | POA: Diagnosis not present

## 2022-02-22 DIAGNOSIS — R220 Localized swelling, mass and lump, head: Secondary | ICD-10-CM | POA: Diagnosis not present

## 2022-02-22 DIAGNOSIS — K047 Periapical abscess without sinus: Secondary | ICD-10-CM | POA: Diagnosis not present

## 2022-03-15 DIAGNOSIS — Z419 Encounter for procedure for purposes other than remedying health state, unspecified: Secondary | ICD-10-CM | POA: Diagnosis not present

## 2022-03-16 DIAGNOSIS — K047 Periapical abscess without sinus: Secondary | ICD-10-CM | POA: Diagnosis not present

## 2022-03-30 ENCOUNTER — Other Ambulatory Visit: Payer: Self-pay | Admitting: "Endocrinology

## 2022-04-15 DIAGNOSIS — Z419 Encounter for procedure for purposes other than remedying health state, unspecified: Secondary | ICD-10-CM | POA: Diagnosis not present

## 2022-04-28 ENCOUNTER — Other Ambulatory Visit: Payer: Self-pay | Admitting: "Endocrinology

## 2022-04-28 DIAGNOSIS — E890 Postprocedural hypothyroidism: Secondary | ICD-10-CM | POA: Diagnosis not present

## 2022-04-29 LAB — TSH: TSH: 2.85 mIU/L

## 2022-04-29 LAB — T4, FREE: Free T4: 1.1 ng/dL (ref 0.8–1.8)

## 2022-05-04 ENCOUNTER — Encounter: Payer: Self-pay | Admitting: "Endocrinology

## 2022-05-04 ENCOUNTER — Ambulatory Visit (INDEPENDENT_AMBULATORY_CARE_PROVIDER_SITE_OTHER): Payer: Medicaid Other | Admitting: "Endocrinology

## 2022-05-04 VITALS — BP 136/90 | HR 72 | Ht 69.0 in | Wt 250.6 lb

## 2022-05-04 DIAGNOSIS — E890 Postprocedural hypothyroidism: Secondary | ICD-10-CM | POA: Diagnosis not present

## 2022-05-04 DIAGNOSIS — I10 Essential (primary) hypertension: Secondary | ICD-10-CM

## 2022-05-04 DIAGNOSIS — Z6838 Body mass index (BMI) 38.0-38.9, adult: Secondary | ICD-10-CM

## 2022-05-04 MED ORDER — METOPROLOL SUCCINATE ER 25 MG PO TB24: 25.0000 mg | ORAL_TABLET | Freq: Every day | ORAL | 1 refills | Status: DC

## 2022-05-04 NOTE — Progress Notes (Signed)
05/04/2022, 11:53 AM                 Endocrinology follow-up note   Subjective:    Patient ID: Cheyenne Campbell, female    DOB: Feb 28, 1971, PCP Susy Frizzle, MD   Past Medical History:  Diagnosis Date   Thyroid disease    History reviewed. No pertinent surgical history. Social History   Socioeconomic History   Marital status: Single    Spouse name: Not on file   Number of children: Not on file   Years of education: Not on file   Highest education level: Not on file  Occupational History   Not on file  Tobacco Use   Smoking status: Never   Smokeless tobacco: Never  Vaping Use   Vaping Use: Never used  Substance and Sexual Activity   Alcohol use: No   Drug use: No   Sexual activity: Not on file  Other Topics Concern   Not on file  Social History Narrative   Not on file   Social Determinants of Health   Financial Resource Strain: Not on file  Food Insecurity: Not on file  Transportation Needs: Not on file  Physical Activity: Not on file  Stress: Not on file  Social Connections: Not on file   Outpatient Encounter Medications as of 05/04/2022  Medication Sig   Cholecalciferol (VITAMIN D-3 PO) Take 1 tablet by mouth daily.   metoprolol succinate (TOPROL-XL) 25 MG 24 hr tablet Take 1 tablet (25 mg total) by mouth daily.   amLODipine (NORVASC) 10 MG tablet TAKE 1 TABLET BY MOUTH EVERY DAY   levothyroxine (SYNTHROID) 50 MCG tablet Take 1 tablet (50 mcg total) by mouth daily before breakfast.   olopatadine (PATANOL) 0.1 % ophthalmic solution Place 1 drop into both eyes 2 (two) times daily.   No facility-administered encounter medications on file as of 05/04/2022.   ALLERGIES: Allergies  Allergen Reactions   Ace Inhibitors Swelling    VACCINATION STATUS: Immunization History  Administered Date(s) Administered   Influenza Split 11/01/2012   Influenza,inj,Quad PF,6+ Mos 12/25/2020   PFIZER  Comirnaty(Gray Top)Covid-19 Tri-Sucrose Vaccine 05/03/2020   PFIZER(Purple Top)SARS-COV-2 Vaccination 10/10/2019, 10/31/2019   Tdap 09/29/2017    HPI Cheyenne Campbell is 52 y.o. female who is being seen in follow-up for  RAI induced hypothyroidism.   She was given I-131 thyroid ablation on Jul 14, 2018. She was started on Levothyroxine at higher doses but Her TFTS did not tolerate. She is currently on levothyroxine 50 mcg p.o. daily before breakfast.  Her previsit thyroid function tests are consistent with appropriate replacement.    She has no concerning symptoms at this time.  She denies palpitations, tremors, heat intolerance.    -She has history of goiter. She has unidentified thyroid dysfunction in 1 of her sisters. She also has hypertension, has not been taking blood pressure medications for the last several months.  Review of Systems  Limited as above.  Objective:    BP (!) 136/90 Comment: BP 138/98 L arm with manuel cuff  Pulse 72   Ht 5' 9"$  (1.753 m)   Wt 250 lb 9.6 oz (113.7 kg)   BMI 37.01 kg/m   Wt  Readings from Last 3 Encounters:  05/04/22 250 lb 9.6 oz (113.7 kg)  01/01/22 259 lb 6.4 oz (117.7 kg)  10/01/21 258 lb (117 kg)    Physical Exam   CMP ( most recent) CMP     Component Value Date/Time   NA 141 12/29/2021 0802   K 4.0 12/29/2021 0802   CL 102 12/29/2021 0802   CO2 32 12/29/2021 0802   GLUCOSE 80 12/29/2021 0802   BUN 11 12/29/2021 0802   CREATININE 0.76 12/29/2021 0802   CALCIUM 9.5 12/29/2021 0802   PROT 8.2 (H) 12/29/2021 0802   AST 20 12/29/2021 0802   ALT 38 (H) 12/29/2021 0802   BILITOT 0.6 12/29/2021 0802   GFRNONAA 90 09/29/2017 0841   GFRAA 104 09/29/2017 0841     April 25, 2018 thyroid uptake and scan: 57.7% uniform uptake in 24 hours consistent with Graves' disease.  Recent Results (from the past 2160 hour(s))  TSH     Status: None   Collection Time: 04/28/22  4:07 PM  Result Value Ref Range   TSH 2.85 mIU/L    Comment:            Reference Range .           > or = 20 Years  0.40-4.50 .                Pregnancy Ranges           First trimester    0.26-2.66           Second trimester   0.55-2.73           Third trimester    0.43-2.91   T4, free     Status: None   Collection Time: 04/28/22  4:07 PM  Result Value Ref Range   Free T4 1.1 0.8 - 1.8 ng/dL     Assessment & Plan:   1.  RAI induced hypothyroidism  2.  Graves' disease-history 3.  Hypertension  She is status post I-131 thyroid ablation for Graves' disease on Jul 14, 2018. Her most recent thyroid function tests are consistent with appropriate replacement.  I discussed in continuing her levothyroxine at 50 mcg p.o. daily before breakfast.    - We discussed about the correct intake of her thyroid hormone, on empty stomach at fasting, with water, separated by at least 30 minutes from breakfast and other medications,  and separated by more than 4 hours from calcium, iron, multivitamins, acid reflux medications (PPIs). -Patient is made aware of the fact that thyroid hormone replacement is needed for life, dose to be adjusted by periodic monitoring of thyroid function tests.   For her hypertension: She continued her amlodipine 10 mg p.o. daily, still has uncontrolled blood pressure.  I discussed and added Toprol 25 mg p.o. daily at breakfast.  She did not tolerate lisinopril/HCTZ during prior attempt.     Regarding her weight concern: She has partial engagement and lost 9 pounds since last visit. - she acknowledges that there is a room for improvement in her food and drink choices. - Suggestion is made for her to avoid simple carbohydrates  from her diet including Cakes, Sweet Desserts, Ice Cream, Soda (diet and regular), Sweet Tea, Candies, Chips, Cookies, Store Bought Juices, Alcohol , Artificial Sweeteners,  Coffee Creamer, and "Sugar-free" Products, Lemonade. This will help patient to have more stable blood glucose profile and potentially avoid  unintended weight gain.  The following Lifestyle Medicine recommendations according to [facility]  of Lifestyle Medicine  [facility]) were discussed and and offered to patient and she  agrees to start the journey:  A. Whole Foods, Plant-Based Nutrition comprising of fruits and vegetables, plant-based proteins, whole-grain carbohydrates was discussed in detail with the patient.   A list for source of those nutrients were also provided to the patient.  Patient will use only water or unsweetened tea for hydration. B.  The need to stay away from risky substances including alcohol, smoking; obtaining 7 to 9 hours of restorative sleep, at least 150 minutes of moderate intensity exercise weekly, the importance of healthy social connections,  and stress management techniques were discussed. C.  A full color page of  Calorie density of various food groups per pound showing examples of each food groups was provided to the patient.   She is advised to maintain close follow-up with her PMD.   I spent  23  minutes in the care of the patient today including review of labs from Thyroid Function, CMP, and other relevant labs ; imaging/biopsy records (current and previous including abstractions from other facilities); face-to-face time discussing  her lab results and symptoms, medications doses, her options of short and long term treatment based on the latest standards of care / guidelines;   and documenting the encounter.  Lura Em Foote  participated in the discussions, expressed understanding, and voiced agreement with the above plans.  All questions were answered to her satisfaction. she is encouraged to contact clinic should she have any questions or concerns prior to her return visit.   Follow up plan: Return in about 6 months (around 11/02/2022) for F/U with Pre-visit Labs.   Glade Lloyd, MD [facility] [address] [place], [place]  [address] Phone: [phone]  Fax: [phone]     05/04/2022, 11:53 AM  This note was partially dictated with voice recognition software. Similar sounding words can be transcribed inadequately or may not  be corrected upon review.

## 2022-05-11 ENCOUNTER — Ambulatory Visit (INDEPENDENT_AMBULATORY_CARE_PROVIDER_SITE_OTHER): Payer: Medicaid Other | Admitting: Family Medicine

## 2022-05-11 ENCOUNTER — Encounter: Payer: Self-pay | Admitting: Family Medicine

## 2022-05-11 VITALS — BP 130/82 | HR 98 | Ht 69.0 in | Wt 251.0 lb

## 2022-05-11 DIAGNOSIS — I10 Essential (primary) hypertension: Secondary | ICD-10-CM

## 2022-05-11 NOTE — Progress Notes (Signed)
Subjective:    Patient ID: Cheyenne Campbell, female    DOB: Nov 16, 1970, 52 y.o.   MRN: [id number]  HPI Patient recently saw her endocrinologist.  At that appointment, her blood pressure was elevated at 136/90.  Endocrinologist recommended that she add Toprol-XL 25 mg daily to amlodipine.  Patient is concerned about taking this much medication.  Here today her blood pressure is 130/82.  She denies any chest pain shortness of breath or dyspnea on exertion Past Medical History:  Diagnosis Date   Thyroid disease   No past surgical history on file. Current Outpatient Medications on File Prior to Visit  Medication Sig Dispense Refill   amLODipine (NORVASC) 10 MG tablet TAKE 1 TABLET BY MOUTH EVERY DAY 90 tablet 0   Cholecalciferol (VITAMIN D-3 PO) Take 1 tablet by mouth daily.     levothyroxine (SYNTHROID) 50 MCG tablet Take 1 tablet (50 mcg total) by mouth daily before breakfast. 90 tablet 1   metoprolol succinate (TOPROL-XL) 25 MG 24 hr tablet Take 1 tablet (25 mg total) by mouth daily. 90 tablet 1   olopatadine (PATANOL) 0.1 % ophthalmic solution Place 1 drop into both eyes 2 (two) times daily. 5 mL 3   No current facility-administered medications on file prior to visit.   Allergies  Allergen Reactions   Ace Inhibitors Swelling   Social History   Socioeconomic History   Marital status: Single    Spouse name: Not on file   Number of children: Not on file   Years of education: Not on file   Highest education level: Not on file  Occupational History   Not on file  Tobacco Use   Smoking status: Never   Smokeless tobacco: Never  Vaping Use   Vaping Use: Never used  Substance and Sexual Activity   Alcohol use: No   Drug use: No   Sexual activity: Not on file  Other Topics Concern   Not on file  Social History Narrative   Not on file   Social Determinants of Health   Financial Resource Strain: Not on file  Food Insecurity: Not on file  Transportation Needs: Not on file   Physical Activity: Not on file  Stress: Not on file  Social Connections: Not on file  Intimate Partner Violence: Not on file    Review of Systems  All other systems reviewed and are negative.      Objective:   Physical Exam Constitutional:      General: She is not in acute distress.    Appearance: Normal appearance. She is obese. She is not ill-appearing or toxic-appearing.  Cardiovascular:     Rate and Rhythm: Normal rate and regular rhythm.     Heart sounds: Normal heart sounds.  Pulmonary:     Effort: Pulmonary effort is normal.     Breath sounds: Normal breath sounds.  Neurological:     Mental Status: She is alert.           Assessment & Plan:    Essential hypertension Her blood pressure today is outstanding.  She is only been taking amlodipine 10 mg a day.  I explained to her that we want to keep her systolic blood pressure under [id number] and her diastolic blood pressure 90.  Therefore I have asked her to check her blood pressure daily.  If she consistently sees systolic blood pressures greater than [id number] or diastolic blood pressures greater than 90, I want her to start taking Toprol 25 mg daily.  Patient is comfortable with this plan.  She previously had bad reaction to send pill/HCTZ prompting her to pick Toprol.  I reassured her that this medication is a very good medication and will be safe to take.  The main side effect being fatigue or dizziness

## 2022-05-14 DIAGNOSIS — Z419 Encounter for procedure for purposes other than remedying health state, unspecified: Secondary | ICD-10-CM | POA: Diagnosis not present

## 2022-06-14 DIAGNOSIS — Z419 Encounter for procedure for purposes other than remedying health state, unspecified: Secondary | ICD-10-CM | POA: Diagnosis not present

## 2022-07-05 ENCOUNTER — Other Ambulatory Visit: Payer: Self-pay | Admitting: "Endocrinology

## 2022-07-10 ENCOUNTER — Other Ambulatory Visit: Payer: Self-pay | Admitting: "Endocrinology

## 2022-07-14 DIAGNOSIS — Z419 Encounter for procedure for purposes other than remedying health state, unspecified: Secondary | ICD-10-CM | POA: Diagnosis not present

## 2022-07-20 ENCOUNTER — Other Ambulatory Visit: Payer: Self-pay | Admitting: Family Medicine

## 2022-07-20 DIAGNOSIS — Z1231 Encounter for screening mammogram for malignant neoplasm of breast: Secondary | ICD-10-CM

## 2022-08-14 DIAGNOSIS — Z419 Encounter for procedure for purposes other than remedying health state, unspecified: Secondary | ICD-10-CM | POA: Diagnosis not present

## 2022-08-25 ENCOUNTER — Telehealth: Payer: Self-pay | Admitting: Family Medicine

## 2022-08-25 NOTE — Telephone Encounter (Signed)
Patient called to follow up on provider's referral for a mammogram. Requesting call back to schedule.  Please advise at [phone].

## 2022-09-13 DIAGNOSIS — Z419 Encounter for procedure for purposes other than remedying health state, unspecified: Secondary | ICD-10-CM | POA: Diagnosis not present

## 2022-09-15 ENCOUNTER — Ambulatory Visit
Admission: RE | Admit: 2022-09-15 | Discharge: 2022-09-15 | Disposition: A | Discharge status: Home / Self Care | Payer: Medicaid Other | Source: Ambulatory Visit | Attending: Family Medicine | Admitting: Family Medicine

## 2022-09-15 DIAGNOSIS — Z1231 Encounter for screening mammogram for malignant neoplasm of breast: Secondary | ICD-10-CM

## 2022-10-06 ENCOUNTER — Ambulatory Visit: Payer: Medicaid Other | Admitting: Family Medicine

## 2022-10-06 ENCOUNTER — Encounter: Payer: Self-pay | Admitting: Family Medicine

## 2022-10-06 ENCOUNTER — Ambulatory Visit (INDEPENDENT_AMBULATORY_CARE_PROVIDER_SITE_OTHER): Payer: Medicaid Other | Admitting: Family Medicine

## 2022-10-06 VITALS — BP 130/90 | HR 96 | Temp 98.7°F | Ht 69.0 in | Wt 250.0 lb

## 2022-10-06 DIAGNOSIS — R0982 Postnasal drip: Secondary | ICD-10-CM | POA: Diagnosis not present

## 2022-10-06 MED ORDER — FLUTICASONE PROPIONATE 50 MCG/ACT NA SUSP: 2.0000 | Freq: Every day | NASAL | 6 refills | Status: DC

## 2022-10-06 NOTE — Assessment & Plan Note (Signed)
Patients symptom is isolated to a cough in AM. I believe she is experiencing some post-nasal drip. Will start Flonase nightly. If not relieved will consider alternative diagnosis such as GERD. Return to office if symptoms persist or worsen.

## 2022-10-06 NOTE — Progress Notes (Signed)
Subjective:  HPI: Cheyenne Campbell is a 52 y.o. female presenting on 10/06/2022 for Acute Visit (cough (sometimes has urges to cough); causing chest and back pain; feels/sounds like possible mucous buildup in lungs that won't break up - JBG\\\)   HPI Patient is in today for 1 week of dry cough in AM that is causing pain in her back and mid-sternum when she coughs. The cough is only in the morning and feels like she needs to clear her throat. The pain only occurs in AM when she coughs. She is not coughing overnight or during  the day. Denies fever, congestion, rhinorrhea, body aches, chills, ear pain, sore throat, nausea, vomiting, diarrhea, shortness of breath, pleurisy. No heartburn or burping.   Review of Systems  All other systems reviewed and are negative.   Relevant past medical history reviewed and updated as indicated.   Past Medical History:  Diagnosis Date   Thyroid disease      No past surgical history on file.  Allergies and medications reviewed and updated.   Current Outpatient Medications:    amLODipine (NORVASC) 10 MG tablet, TAKE 1 TABLET BY MOUTH EVERY DAY, Disp: 90 tablet, Rfl: 0   fluticasone (FLONASE) 50 MCG/ACT nasal spray, Place 2 sprays into both nostrils daily., Disp: 16 g, Rfl: 6   levothyroxine (SYNTHROID) 50 MCG tablet, TAKE 1 TABLET BY MOUTH DAILY BEFORE BREAKFAST, Disp: 90 tablet, Rfl: 1   metoprolol succinate (TOPROL-XL) 25 MG 24 hr tablet, Take 1 tablet (25 mg total) by mouth daily., Disp: 90 tablet, Rfl: 1   olopatadine (PATANOL) 0.1 % ophthalmic solution, Place 1 drop into both eyes 2 (two) times daily., Disp: 5 mL, Rfl: 3   Cholecalciferol (VITAMIN D-3 PO), Take 1 tablet by mouth daily. (Patient not taking: Reported on 10/06/2022), Disp: , Rfl:   Allergies  Allergen Reactions   Ace Inhibitors Swelling    Objective:   BP (!) 130/90   Pulse 96   Temp 98.7 F (37.1 C) (Oral)   Ht 5\' 9"  (1.753 m)   Wt 250 lb (113.4 kg)   SpO2 97%   BMI  36.92 kg/m      10/06/2022   10:23 AM 05/11/2022   11:59 AM 05/04/2022   11:23 AM  Vitals with BMI  Height 5\' 9"  5\' 9"  5\' 9"   Weight 250 lbs 251 lbs 250 lbs 10 oz  BMI 36.9 37.05 36.99  Systolic 130 130 409  Diastolic 90 82 90  Pulse 96 98 72     Physical Exam Vitals and nursing note reviewed.  Constitutional:      Appearance: Normal appearance. She is normal weight.  HENT:     Head: Normocephalic and atraumatic.  Cardiovascular:     Rate and Rhythm: Normal rate and regular rhythm.     Pulses: Normal pulses.     Heart sounds: Normal heart sounds.  Pulmonary:     Effort: Pulmonary effort is normal.     Breath sounds: Normal breath sounds.  Skin:    General: Skin is warm and dry.  Neurological:     General: No focal deficit present.     Mental Status: She is alert and oriented to person, place, and time. Mental status is at baseline.  Psychiatric:        Mood and Affect: Mood normal.        Behavior: Behavior normal.        Thought Content: Thought content normal.  Judgment: Judgment normal.     Assessment & Plan:  Post-nasal drip Assessment & Plan: Patients symptom is isolated to a cough in AM. I believe she is experiencing some post-nasal drip. Will start Flonase nightly. If not relieved will consider alternative diagnosis such as GERD. Return to office if symptoms persist or worsen.   Other orders -     Fluticasone Propionate; Place 2 sprays into both nostrils daily.  Dispense: 16 g; Refill: 6     Follow up plan: Return if symptoms worsen or fail to improve.  Park Meo, FNP

## 2022-10-14 DIAGNOSIS — Z419 Encounter for procedure for purposes other than remedying health state, unspecified: Secondary | ICD-10-CM | POA: Diagnosis not present

## 2022-10-27 ENCOUNTER — Other Ambulatory Visit: Payer: Self-pay | Admitting: "Endocrinology

## 2022-10-27 DIAGNOSIS — I10 Essential (primary) hypertension: Secondary | ICD-10-CM | POA: Diagnosis not present

## 2022-10-27 DIAGNOSIS — E890 Postprocedural hypothyroidism: Secondary | ICD-10-CM | POA: Diagnosis not present

## 2022-10-28 LAB — COMPLETE METABOLIC PANEL WITH GFR
AG Ratio: 0.9 (calc) — ABNORMAL LOW (ref 1.0–2.5)
ALT: 27 U/L (ref 6–29)
AST: 18 U/L (ref 10–35)
Albumin: 3.7 g/dL (ref 3.6–5.1)
Alkaline phosphatase (APISO): 98 U/L (ref 37–153)
BUN/Creatinine Ratio: 9 (calc) (ref 6–22)
BUN: 6 mg/dL — ABNORMAL LOW (ref 7–25)
CO2: 29 mmol/L (ref 20–32)
Calcium: 9.2 mg/dL (ref 8.6–10.4)
Chloride: 103 mmol/L (ref 98–110)
Creat: 0.66 mg/dL (ref 0.50–1.03)
Globulin: 4.1 g/dL — ABNORMAL HIGH (ref 1.9–3.7)
Glucose, Bld: 80 mg/dL (ref 65–99)
Potassium: 4 mmol/L (ref 3.5–5.3)
Sodium: 141 mmol/L (ref 135–146)
Total Bilirubin: 0.4 mg/dL (ref 0.2–1.2)
Total Protein: 7.8 g/dL (ref 6.1–8.1)
eGFR: 105 mL/min/{1.73_m2} (ref >=60)

## 2022-10-28 LAB — T4, FREE: Free T4: 1.2 ng/dL (ref 0.8–1.8)

## 2022-10-28 LAB — TSH: TSH: 1.1 m[IU]/L

## 2022-11-02 ENCOUNTER — Ambulatory Visit (INDEPENDENT_AMBULATORY_CARE_PROVIDER_SITE_OTHER): Payer: Medicaid Other | Admitting: "Endocrinology

## 2022-11-02 ENCOUNTER — Encounter: Payer: Self-pay | Admitting: "Endocrinology

## 2022-11-02 VITALS — BP 134/88 | HR 64 | Ht 69.0 in | Wt 241.0 lb

## 2022-11-02 DIAGNOSIS — Z6838 Body mass index (BMI) 38.0-38.9, adult: Secondary | ICD-10-CM | POA: Diagnosis not present

## 2022-11-02 DIAGNOSIS — E890 Postprocedural hypothyroidism: Secondary | ICD-10-CM

## 2022-11-02 DIAGNOSIS — I10 Essential (primary) hypertension: Secondary | ICD-10-CM

## 2022-11-02 NOTE — Progress Notes (Signed)
11/02/2022, 3:07 PM                 Endocrinology follow-up note   Subjective:    Patient ID: Cheyenne Campbell, female    DOB: May 22, 1970, PCP Cheyenne Brooks, MD   Past Medical History:  Diagnosis Date   Thyroid disease    History reviewed. No pertinent surgical history. Social History   Socioeconomic History   Marital status: Single    Spouse name: Not on file   Number of children: Not on file   Years of education: Not on file   Highest education level: Not on file  Occupational History   Not on file  Tobacco Use   Smoking status: Never   Smokeless tobacco: Never  Vaping Use   Vaping status: Never Used  Substance and Sexual Activity   Alcohol use: No   Drug use: No   Sexual activity: Not on file  Other Topics Concern   Not on file  Social History Narrative   Not on file   Social Determinants of Health   Financial Resource Strain: Not on file  Food Insecurity: Not on file  Transportation Needs: Not on file  Physical Activity: Not on file  Stress: Not on file  Social Connections: Not on file   Outpatient Encounter Medications as of 11/02/2022  Medication Sig   amLODipine (NORVASC) 10 MG tablet TAKE 1 TABLET BY MOUTH EVERY DAY   Cholecalciferol (VITAMIN D-3 PO) Take 1 tablet by mouth daily. (Patient not taking: Reported on 10/06/2022)   fluticasone (FLONASE) 50 MCG/ACT nasal spray Place 2 sprays into both nostrils daily.   levothyroxine (SYNTHROID) 50 MCG tablet TAKE 1 TABLET BY MOUTH DAILY BEFORE BREAKFAST   metoprolol succinate (TOPROL-XL) 25 MG 24 hr tablet Take 1 tablet (25 mg total) by mouth daily. (Patient not taking: Reported on 11/02/2022)   olopatadine (PATANOL) 0.1 % ophthalmic solution Place 1 drop into both eyes 2 (two) times daily.   No facility-administered encounter medications on file as of 11/02/2022.   ALLERGIES: Allergies  Allergen Reactions   Ace Inhibitors Swelling     VACCINATION STATUS: Immunization History  Administered Date(s) Administered   Influenza Split 11/01/2012   Influenza,inj,Quad PF,6+ Mos 12/25/2020   PFIZER Comirnaty(Gray Top)Covid-19 Tri-Sucrose Vaccine 05/03/2020   PFIZER(Purple Top)SARS-COV-2 Vaccination 10/10/2019, 10/31/2019   Tdap 09/29/2017    HPI Cheyenne Campbell is 52 y.o. female who is being seen in follow-up for  RAI induced hypothyroidism.   She was given I-131 thyroid ablation on Jul 14, 2018. She was started on Levothyroxine at higher doses but Her TFTS did not tolerate. She is currently on levothyroxine 50 mcg p.o. daily before breakfast.  Her previsit thyroid function tests are consistent with appropriate replacement.     She has no concerning symptoms at this time.  She denies palpitations, tremors, heat intolerance.    -She has history of goiter. She has unidentified thyroid dysfunction in 1 of her sisters. She also has hypertension, has not been taking blood pressure medications for the last several months. She is attempting to change her lifestyle lately, achieved 18 pounds of weight loss since last year. Review of Systems  Limited as above.  Objective:  BP 134/88   Pulse 64   Ht 5\' 9"  (1.753 m)   Wt 241 lb (109.3 kg)   BMI 35.59 kg/m   Wt Readings from Last 3 Encounters:  11/02/22 241 lb (109.3 kg)  10/06/22 250 lb (113.4 kg)  05/11/22 251 lb (113.9 kg)    Physical Exam   CMP ( most recent) CMP     Component Value Date/Time   NA 141 10/27/2022 0842   K 4.0 10/27/2022 0842   CL 103 10/27/2022 0842   CO2 29 10/27/2022 0842   GLUCOSE 80 10/27/2022 0842   BUN 6 (L) 10/27/2022 0842   CREATININE 0.66 10/27/2022 0842   CALCIUM 9.2 10/27/2022 0842   PROT 7.8 10/27/2022 0842   AST 18 10/27/2022 0842   ALT 27 10/27/2022 0842   BILITOT 0.4 10/27/2022 0842   GFRNONAA 90 09/29/2017 0841   GFRAA 104 09/29/2017 0841     April 25, 2018 thyroid uptake and scan: 57.7% uniform uptake in 24  hours consistent with Graves' disease.  Recent Results (from the past 2160 hour(s))  COMPLETE METABOLIC PANEL WITH GFR     Status: Abnormal   Collection Time: 10/27/22  8:42 AM  Result Value Ref Range   Glucose, Bld 80 65 - 99 mg/dL    Comment: .            Fasting reference interval .    BUN 6 (L) 7 - 25 mg/dL   Creat 0.86 5.78 - 4.69 mg/dL   eGFR 629 > OR = 60 BM/WUX/3.24M0   BUN/Creatinine Ratio 9 6 - 22 (calc)   Sodium 141 135 - 146 mmol/L   Potassium 4.0 3.5 - 5.3 mmol/L   Chloride 103 98 - 110 mmol/L   CO2 29 20 - 32 mmol/L   Calcium 9.2 8.6 - 10.4 mg/dL   Total Protein 7.8 6.1 - 8.1 g/dL   Albumin 3.7 3.6 - 5.1 g/dL   Globulin 4.1 (H) 1.9 - 3.7 g/dL (calc)   AG Ratio 0.9 (L) 1.0 - 2.5 (calc)   Total Bilirubin 0.4 0.2 - 1.2 mg/dL   Alkaline phosphatase (APISO) 98 37 - 153 U/L   AST 18 10 - 35 U/L   ALT 27 6 - 29 U/L  TSH     Status: None   Collection Time: 10/27/22  8:42 AM  Result Value Ref Range   TSH 1.10 mIU/L    Comment:           Reference Range .           > or = 20 Years  0.40-4.50 .                Pregnancy Ranges           First trimester    0.26-2.66           Second trimester   0.55-2.73           Third trimester    0.43-2.91   T4, free     Status: None   Collection Time: 10/27/22  8:42 AM  Result Value Ref Range   Free T4 1.2 0.8 - 1.8 ng/dL     Assessment & Plan:   1.  RAI induced hypothyroidism  2.  Graves' disease-history 3.  Hypertension  She is status post I-131 thyroid ablation for Graves' disease on Jul 14, 2018. Her most recent thyroid function tests are consistent with appropriate replacement.  She is advised to continue levothyroxine 50  mcg p.o. daily before breakfast.     - We discussed about the correct intake of her thyroid hormone, on empty stomach at fasting, with water, separated by at least 30 minutes from breakfast and other medications,  and separated by more than 4 hours from calcium, iron, multivitamins, acid reflux  medications (PPIs). -Patient is made aware of the fact that thyroid hormone replacement is needed for life, dose to be adjusted by periodic monitoring of thyroid function tests.  For her hypertension: She continued her amlodipine 10 mg p.o. daily, still has uncontrolled blood pressure, also on Toprol 25 mg p.o. daily at breakfast. She did not tolerate lisinopril/HCTZ during prior attempt.     Regarding her weight concern: She has partial engagement and los 18 pounds since last year. - she acknowledges that there is a room for improvement in her food and drink choices. - Suggestion is made for her to avoid simple carbohydrates  from her diet including Cakes, Sweet Desserts, Ice Cream, Soda (diet and regular), Sweet Tea, Candies, Chips, Cookies, Store Bought Juices, Alcohol , Artificial Sweeteners,  Coffee Creamer, and "Sugar-free" Products, Lemonade. This will help patient to have more stable blood glucose profile and potentially avoid unintended weight gain.  The following Lifestyle Medicine recommendations according to [employer] of Lifestyle Medicine  [facility]) were discussed and and offered to patient and she  agrees to start the journey:  A. Whole Foods, Plant-Based Nutrition comprising of fruits and vegetables, plant-based proteins, whole-grain carbohydrates was discussed in detail with the patient.   A list for source of those nutrients were also provided to the patient.  Patient will use only water or unsweetened tea for hydration. B.  The need to stay away from risky substances including alcohol, smoking; obtaining 7 to 9 hours of restorative sleep, at least 150 minutes of moderate intensity exercise weekly, the importance of healthy social connections,  and stress management techniques were discussed. C.  A full color page of  Calorie density of various food groups per pound showing examples of each food groups was provided to the patient.    She is advised to maintain close follow-up  with her PMD.    I spent  26  minutes in the care of the patient today including review of labs from Thyroid Function, CMP, and other relevant labs ; imaging/biopsy records (current and previous including abstractions from other facilities); face-to-face time discussing  her lab results and symptoms, medications doses, her options of short and long term treatment based on the latest standards of care / guidelines;   and documenting the encounter.  Jettie Booze Lenn  participated in the discussions, expressed understanding, and voiced agreement with the above plans.  All questions were answered to her satisfaction. she is encouraged to contact clinic should she have any questions or concerns prior to her return visit.   Follow up plan: Return in about 6 months (around 05/05/2023) for Fasting Labs  in AM B4 8.   Marquis Lunch, MD [facility] [address] [place], [place] [address] Phone: [phone]  Fax: [phone]     11/02/2022, 3:07 PM  This note was partially dictated with voice recognition software. Similar sounding words can be transcribed inadequately or may not  be corrected upon review.

## 2022-11-14 DIAGNOSIS — Z419 Encounter for procedure for purposes other than remedying health state, unspecified: Secondary | ICD-10-CM | POA: Diagnosis not present

## 2022-12-14 DIAGNOSIS — Z419 Encounter for procedure for purposes other than remedying health state, unspecified: Secondary | ICD-10-CM | POA: Diagnosis not present

## 2023-01-09 ENCOUNTER — Other Ambulatory Visit: Payer: Self-pay | Admitting: "Endocrinology

## 2023-01-14 DIAGNOSIS — Z419 Encounter for procedure for purposes other than remedying health state, unspecified: Secondary | ICD-10-CM | POA: Diagnosis not present

## 2023-01-17 DIAGNOSIS — K047 Periapical abscess without sinus: Secondary | ICD-10-CM | POA: Diagnosis not present

## 2023-01-25 IMAGING — MG MM DIGITAL SCREENING BILAT W/ TOMO AND CAD
6 of 10 series · 6 of 30 positions shown · non-contrast
Comparison: Previous exam(s).

CLINICAL DATA: Screening.

EXAM:
DIGITAL SCREENING BILATERAL MAMMOGRAM WITH TOMOSYNTHESIS AND CAD
TECHNIQUE: Bilateral screening digital craniocaudal and mediolateral oblique
mammograms were obtained. Bilateral screening digital breast
tomosynthesis was performed. The images were evaluated with
computer-aided detection.

[R MLO synth-2D]
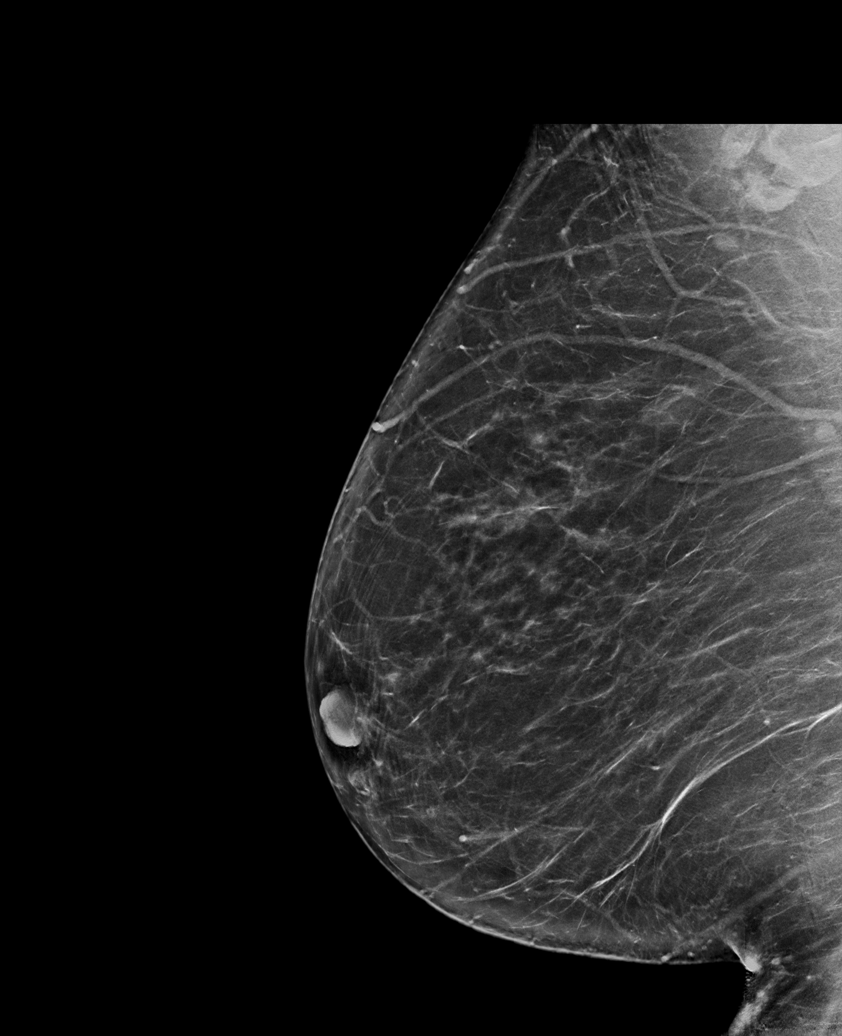

[L CC synth-2D (1 of 2)]
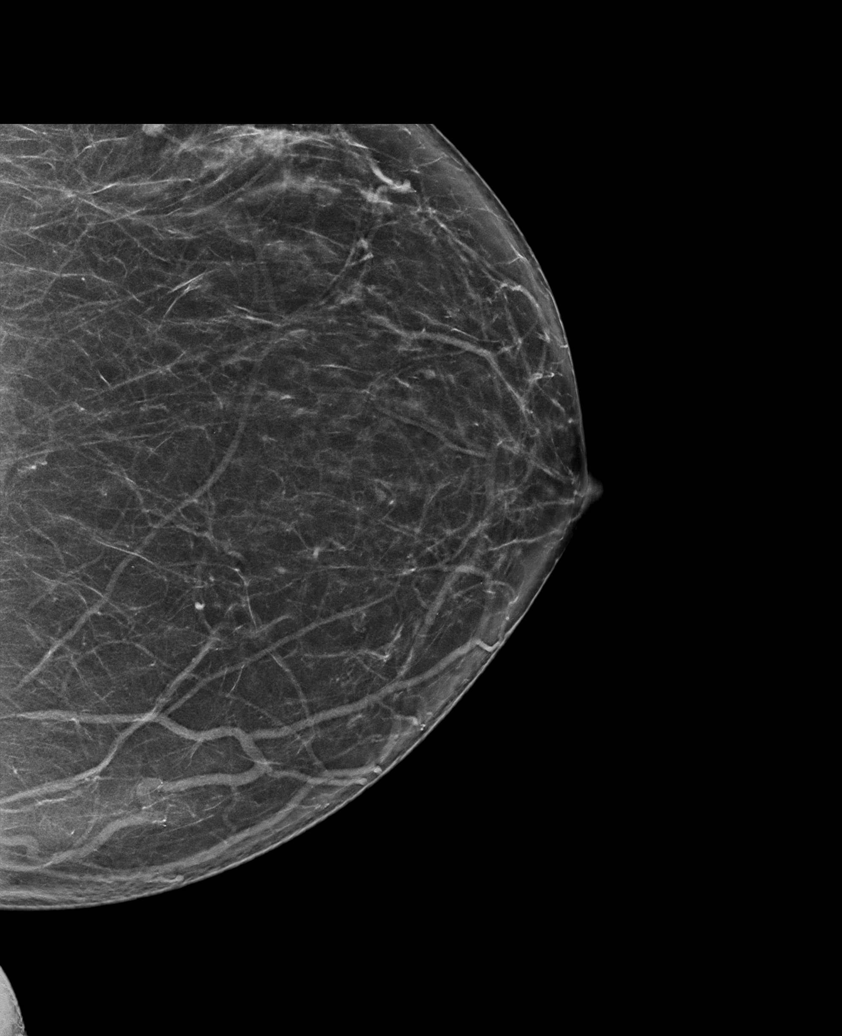

[R CC synth-2D]
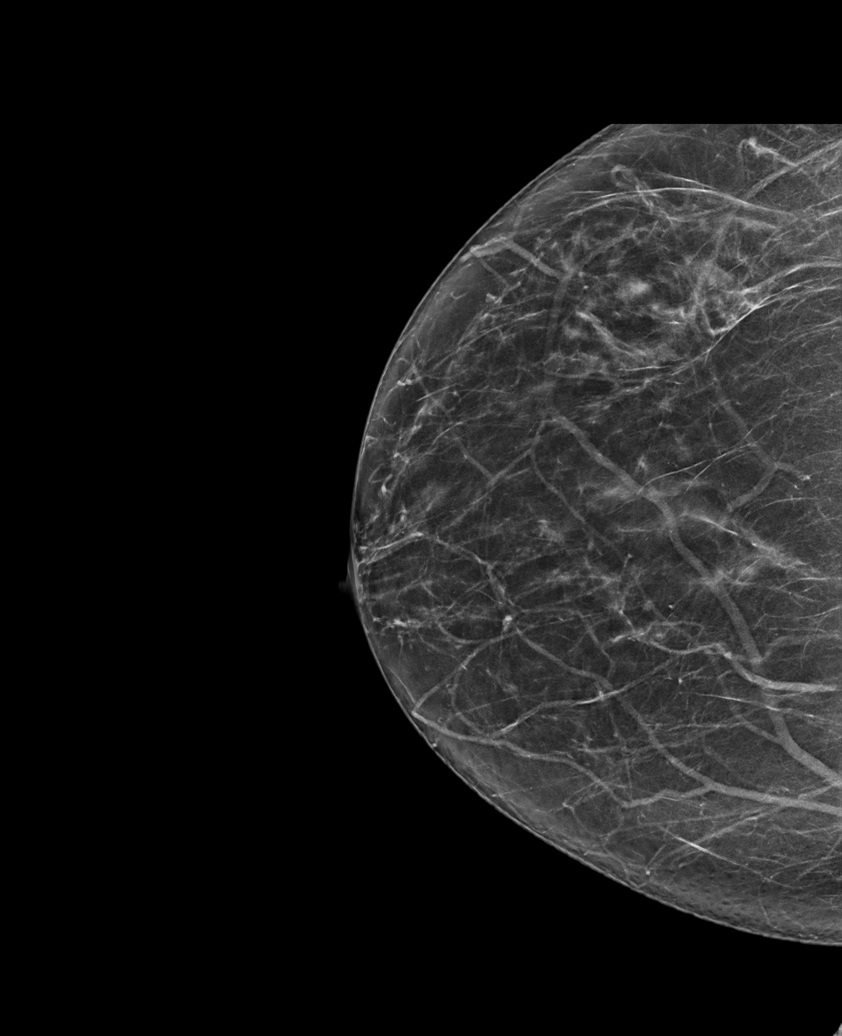

[L MLO synth-2D]
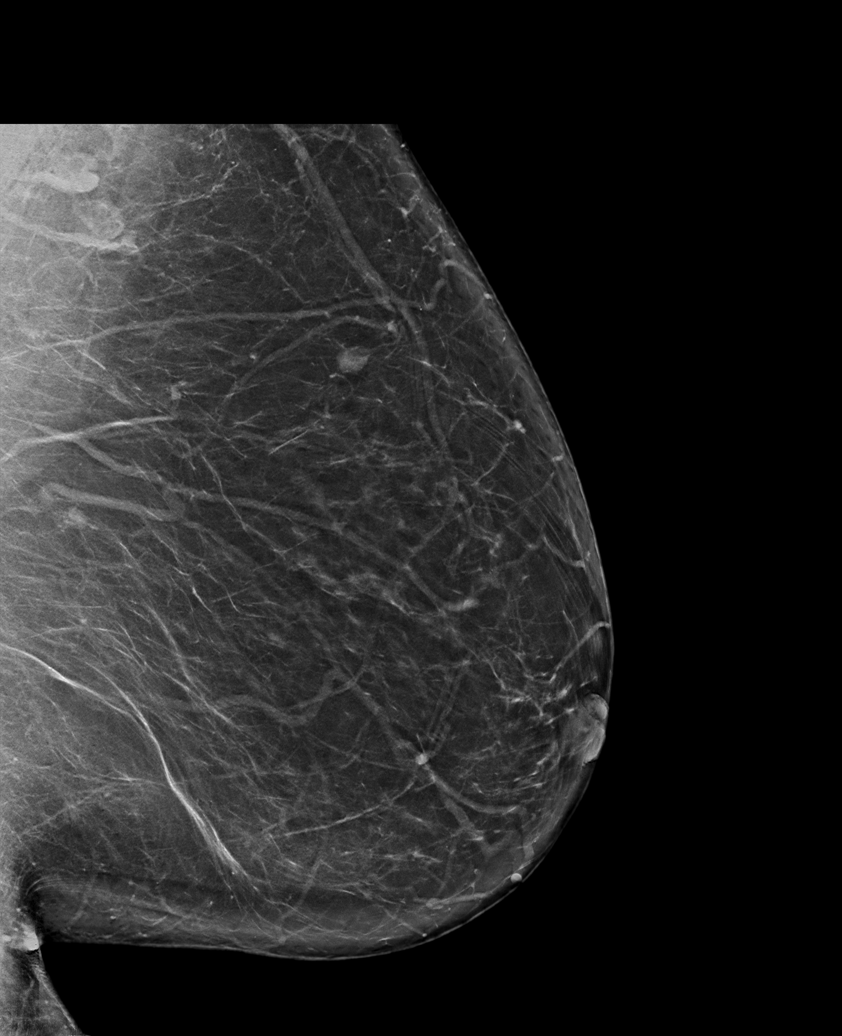

[L CC synth-2D (2 of 2)]
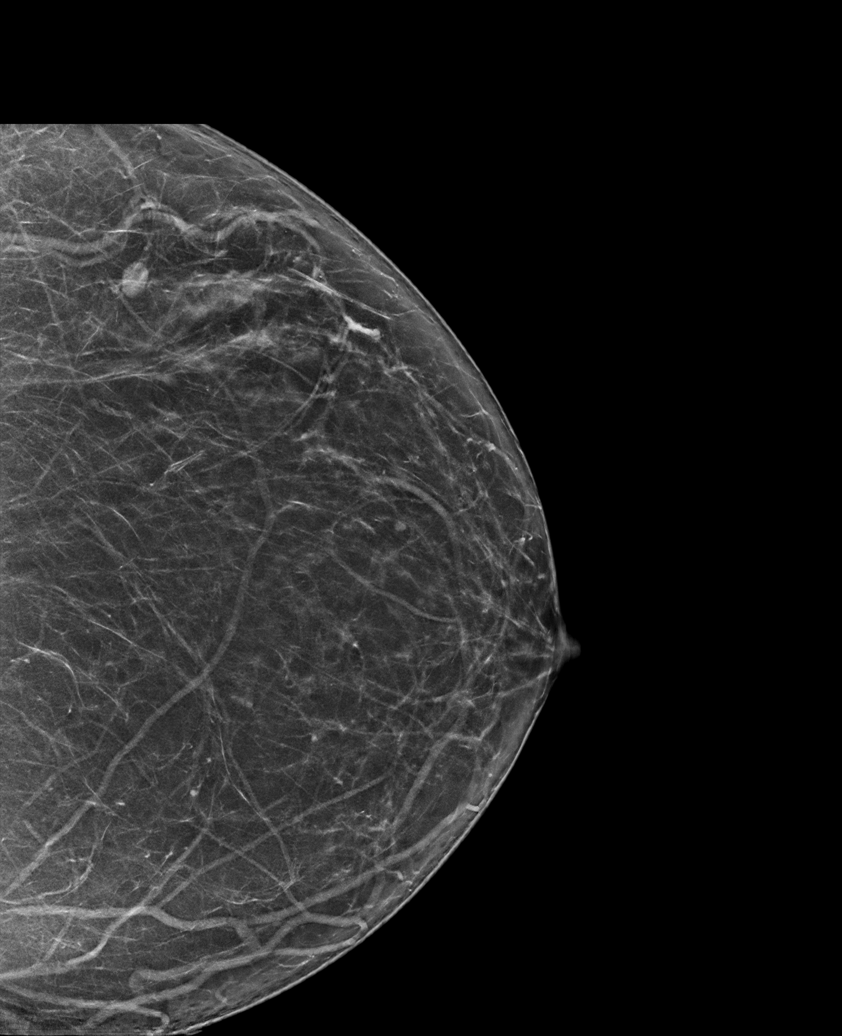

[L CC tomo · tomo slice 39/76.0]
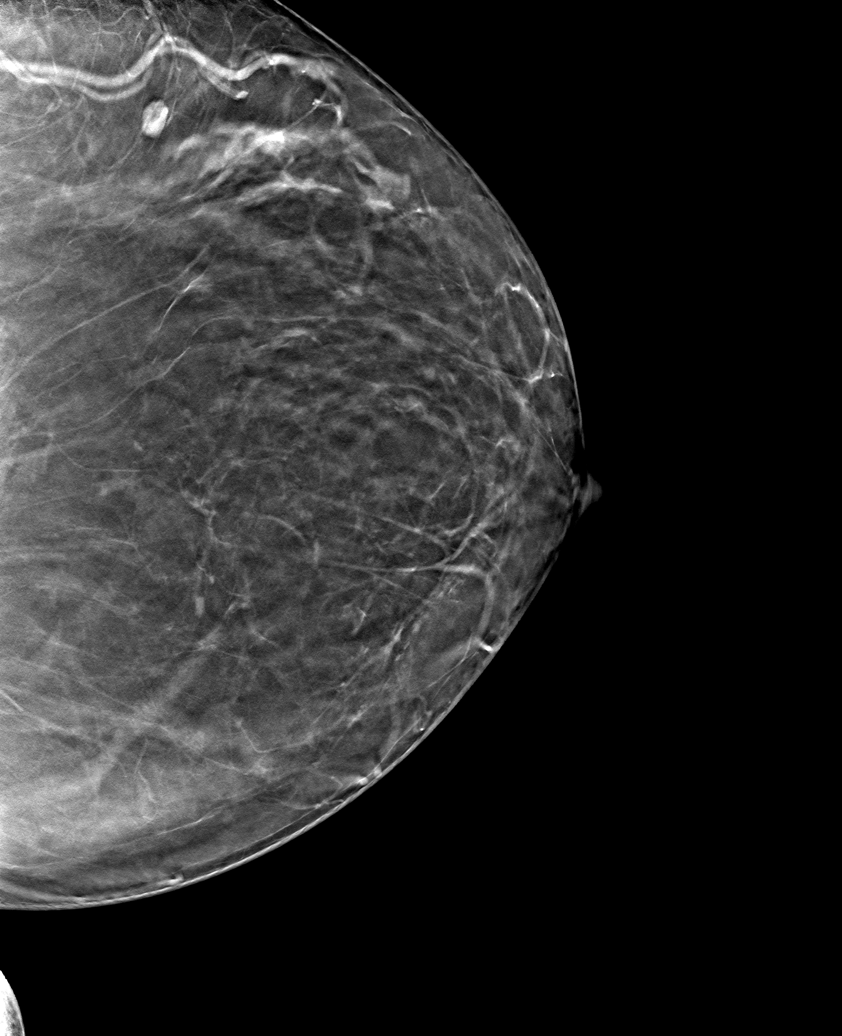

[6 of 30 positions shown; findings below may reference images not displayed]

ACR Breast Density Category b: There are scattered areas of
fibroglandular density.
FINDINGS: There are no findings suspicious for malignancy.
IMPRESSION: No mammographic evidence of malignancy. A result letter of this
screening mammogram will be mailed directly to the patient.

RECOMMENDATION:
Screening mammogram in one year. (Code:51-O-LD2)

BI-RADS CATEGORY  1: Negative.

## 2023-02-11 ENCOUNTER — Other Ambulatory Visit: Payer: Self-pay | Admitting: "Endocrinology

## 2023-02-13 DIAGNOSIS — Z419 Encounter for procedure for purposes other than remedying health state, unspecified: Secondary | ICD-10-CM | POA: Diagnosis not present

## 2023-03-16 DIAGNOSIS — Z419 Encounter for procedure for purposes other than remedying health state, unspecified: Secondary | ICD-10-CM | POA: Diagnosis not present

## 2023-04-16 DIAGNOSIS — Z419 Encounter for procedure for purposes other than remedying health state, unspecified: Secondary | ICD-10-CM | POA: Diagnosis not present

## 2023-05-02 ENCOUNTER — Other Ambulatory Visit: Payer: Self-pay | Admitting: "Endocrinology

## 2023-05-02 DIAGNOSIS — E890 Postprocedural hypothyroidism: Secondary | ICD-10-CM | POA: Diagnosis not present

## 2023-05-03 LAB — LIPID PANEL
Cholesterol: 134 mg/dL (ref <200)
HDL: 64 mg/dL (ref >=50)
LDL Cholesterol (Calc): 58 mg/dL
Non-HDL Cholesterol (Calc): 70 mg/dL (ref <130)
Total CHOL/HDL Ratio: 2.1 (calc) (ref <5.0)
Triglycerides: 42 mg/dL (ref <150)

## 2023-05-03 LAB — T4, FREE: Free T4: 1.2 ng/dL (ref 0.8–1.8)

## 2023-05-03 LAB — TSH: TSH: 2.38 m[IU]/L

## 2023-05-05 ENCOUNTER — Ambulatory Visit: Payer: Medicaid Other | Admitting: "Endocrinology

## 2023-05-13 ENCOUNTER — Other Ambulatory Visit: Payer: Self-pay | Admitting: "Endocrinology

## 2023-05-14 DIAGNOSIS — Z419 Encounter for procedure for purposes other than remedying health state, unspecified: Secondary | ICD-10-CM | POA: Diagnosis not present

## 2023-05-23 ENCOUNTER — Ambulatory Visit (INDEPENDENT_AMBULATORY_CARE_PROVIDER_SITE_OTHER): Payer: Medicaid Other | Admitting: "Endocrinology

## 2023-05-23 ENCOUNTER — Encounter: Payer: Self-pay | Admitting: "Endocrinology

## 2023-05-23 VITALS — BP 134/88 | HR 72 | Ht 69.0 in | Wt 249.8 lb

## 2023-05-23 DIAGNOSIS — E890 Postprocedural hypothyroidism: Secondary | ICD-10-CM

## 2023-05-23 DIAGNOSIS — I10 Essential (primary) hypertension: Secondary | ICD-10-CM | POA: Diagnosis not present

## 2023-05-23 DIAGNOSIS — Z6838 Body mass index (BMI) 38.0-38.9, adult: Secondary | ICD-10-CM

## 2023-05-23 DIAGNOSIS — E66812 Obesity, class 2: Secondary | ICD-10-CM | POA: Diagnosis not present

## 2023-05-23 NOTE — Progress Notes (Signed)
 05/23/2023, 1:15 PM                 Endocrinology follow-up note   Subjective:    Patient ID: Cheyenne Campbell, female    DOB: 04/19/1970, PCP Donita Brooks, MD   Past Medical History:  Diagnosis Date   Thyroid disease    History reviewed. No pertinent surgical history. Social History   Socioeconomic History   Marital status: Single    Spouse name: Not on file   Number of children: Not on file   Years of education: Not on file   Highest education level: Not on file  Occupational History   Not on file  Tobacco Use   Smoking status: Never   Smokeless tobacco: Never  Vaping Use   Vaping status: Never Used  Substance and Sexual Activity   Alcohol use: No   Drug use: No   Sexual activity: Not on file  Other Topics Concern   Not on file  Social History Narrative   Not on file   Social Drivers of Health   Financial Resource Strain: Not on file  Food Insecurity: Not on file  Transportation Needs: Not on file  Physical Activity: Not on file  Stress: Not on file  Social Connections: Not on file   Outpatient Encounter Medications as of 05/23/2023  Medication Sig   amLODipine (NORVASC) 10 MG tablet TAKE 1 TABLET BY MOUTH EVERY DAY   Cholecalciferol (VITAMIN D-3 PO) Take 1 tablet by mouth daily. (Patient not taking: Reported on 10/06/2022)   levothyroxine (SYNTHROID) 50 MCG tablet TAKE 1 TABLET BY MOUTH EVERY DAY BEFORE BREAKFAST   metoprolol succinate (TOPROL-XL) 25 MG 24 hr tablet Take 1 tablet (25 mg total) by mouth daily. (Patient not taking: Reported on 11/02/2022)   olopatadine (PATANOL) 0.1 % ophthalmic solution Place 1 drop into both eyes 2 (two) times daily.   [DISCONTINUED] fluticasone (FLONASE) 50 MCG/ACT nasal spray Place 2 sprays into both nostrils daily.   No facility-administered encounter medications on file as of 05/23/2023.   ALLERGIES: Allergies  Allergen Reactions   Ace Inhibitors Swelling     VACCINATION STATUS: Immunization History  Administered Date(s) Administered   Influenza Split 11/01/2012   Influenza,inj,Quad PF,6+ Mos 12/25/2020   PFIZER Comirnaty(Gray Top)Covid-19 Tri-Sucrose Vaccine 05/03/2020   PFIZER(Purple Top)SARS-COV-2 Vaccination 10/10/2019, 10/31/2019   Tdap 09/29/2017    HPI Cheyenne Campbell is 53 y.o. female who is being seen in follow-up for  RAI induced hypothyroidism.   She was given I-131 thyroid ablation on Jul 14, 2018. She was started on Levothyroxine at higher doses but Her TFTS did not tolerate. She is currently on levothyroxine 50 mcg p.o. daily before breakfast.  Her previsit thyroid function tests are consistent with appropriate replacement.     She has no concerning symptoms at this time.  She denies palpitations, tremors, heat intolerance.    -She has history of goiter. She has unidentified thyroid dysfunction in 1 of her sisters. She also has hypertension, has not been taking blood pressure medications for the last several months. She is attempting to change her lifestyle lately, presents with fluctuating body weight.  Her lipids are controlled. Review of Systems  Limited as  above.  Objective:    BP 134/88   Pulse 72   Ht 5\' 9"  (1.753 m)   Wt 249 lb 12.8 oz (113.3 kg)   BMI 36.89 kg/m   Wt Readings from Last 3 Encounters:  05/23/23 249 lb 12.8 oz (113.3 kg)  11/02/22 241 lb (109.3 kg)  10/06/22 250 lb (113.4 kg)    Physical Exam   CMP ( most recent) CMP     Component Value Date/Time   NA 141 10/27/2022 0842   K 4.0 10/27/2022 0842   CL 103 10/27/2022 0842   CO2 29 10/27/2022 0842   GLUCOSE 80 10/27/2022 0842   BUN 6 (L) 10/27/2022 0842   CREATININE 0.66 10/27/2022 0842   CALCIUM 9.2 10/27/2022 0842   PROT 7.8 10/27/2022 0842   AST 18 10/27/2022 0842   ALT 27 10/27/2022 0842   BILITOT 0.4 10/27/2022 0842   GFRNONAA 90 09/29/2017 0841   GFRAA 104 09/29/2017 0841     April 25, 2018 thyroid uptake and  scan: 57.7% uniform uptake in 24 hours consistent with Graves' disease.  Recent Results (from the past 2160 hours)  Lipid panel     Status: None   Collection Time: 05/02/23  1:41 PM  Result Value Ref Range   Cholesterol 134 <200 mg/dL   HDL 64 > OR = 50 mg/dL   Triglycerides 42 <161 mg/dL   LDL Cholesterol (Calc) 58 mg/dL (calc)    Comment: Reference range: <100 . Desirable range <100 mg/dL for primary prevention;   <70 mg/dL for patients with CHD or diabetic patients  with > or = 2 CHD risk factors. Marland Kitchen LDL-C is now calculated using the Martin-Hopkins  calculation, which is a validated novel method providing  better accuracy than the Friedewald equation in the  estimation of LDL-C.  Horald Pollen et al. Lenox Ahr. 0960;454(09): 2061-2068  (http://education.[facility].com/faq/FAQ164)    Total CHOL/HDL Ratio 2.1 <5.0 (calc)   Non-HDL Cholesterol (Calc) 70 <811 mg/dL (calc)    Comment: For patients with diabetes plus 1 major ASCVD risk  factor, treating to a non-HDL-C goal of <100 mg/dL  (LDL-C of <91 mg/dL) is considered a therapeutic  option.   TSH     Status: None   Collection Time: 05/02/23  1:41 PM  Result Value Ref Range   TSH 2.38 mIU/L    Comment:           Reference Range .           > or = 20 Years  0.40-4.50 .                Pregnancy Ranges           First trimester    0.26-2.66           Second trimester   0.55-2.73           Third trimester    0.43-2.91   T4, free     Status: None   Collection Time: 05/02/23  1:41 PM  Result Value Ref Range   Free T4 1.2 0.8 - 1.8 ng/dL     Assessment & Plan:   1.  RAI induced hypothyroidism  2.  Graves' disease - resolved 3.  Hypertension  She is status post I-131 thyroid ablation for Graves' disease on Jul 14, 2018. Her most recent thyroid function tests are consistent with appropriate replacement.  She is advised to continue levothyroxine 50 mcg p.o. daily before breakfast.     - We  discussed about the correct  intake of her thyroid hormone, on empty stomach at fasting, with water, separated by at least 30 minutes from breakfast and other medications,  and separated by more than 4 hours from calcium, iron, multivitamins, acid reflux medications (PPIs). -Patient is made aware of the fact that thyroid hormone replacement is needed for life, dose to be adjusted by periodic monitoring of thyroid function tests.   hypertension: Controlled.  She continues to tolerate amlodipine 10 mg p.o. daily at breakfast, advised to continue.  She has not taken her beta-blocker since last visit.  She has controlled lipid panel, terms of her weight concern, she is advised to reengage with lifestyle medicine nutrition. - she acknowledges that there is a room for improvement in her food and drink choices. - Suggestion is made for her to avoid simple carbohydrates  from her diet including Cakes, Sweet Desserts, Ice Cream, Soda (diet and regular), Sweet Tea, Candies, Chips, Cookies, Store Bought Juices, Alcohol , Artificial Sweeteners,  Coffee Creamer, and "Sugar-free" Products, Lemonade. This will help patient to have more stable blood glucose profile and potentially avoid unintended weight gain.  The following Lifestyle Medicine recommendations according to American College of Lifestyle Medicine  [facility]) were discussed and and offered to patient and she  agrees to start the journey:  A. Whole Foods, Plant-Based Nutrition comprising of fruits and vegetables, plant-based proteins, whole-grain carbohydrates was discussed in detail with the patient.   A list for source of those nutrients were also provided to the patient.  Patient will use only water or unsweetened tea for hydration. B.  The need to stay away from risky substances including alcohol, smoking; obtaining 7 to 9 hours of restorative sleep, at least 150 minutes of moderate intensity exercise weekly, the importance of healthy social connections,  and stress management  techniques were discussed. C.  A full color page of  Calorie density of various food groups per pound showing examples of each food groups was provided to the patient.  She will be considered for GLP-1 receptor agonist on subsequent visits if her insurance will provide coverage. She is advised to maintain close follow-up with her PMD.   I spent  25  minutes in the care of the patient today including review of labs from Thyroid Function, CMP, and other relevant labs ; imaging/biopsy records (current and previous including abstractions from other facilities); face-to-face time discussing  her lab results and symptoms, medications doses, her options of short and long term treatment based on the latest standards of care / guidelines;   and documenting the encounter.  Jettie Booze Jividen  participated in the discussions, expressed understanding, and voiced agreement with the above plans.  All questions were answered to her satisfaction. she is encouraged to contact clinic should she have any questions or concerns prior to her return visit.    Follow up plan: Return in about 6 months (around 11/23/2023) for F/U with Pre-visit Labs.   Marquis Lunch, MD [facility] [address] [place], [place] [address] Phone: [phone]  Fax: [phone]     05/23/2023, 1:15 PM  This note was partially dictated with voice recognition software. Similar sounding words can be transcribed inadequately or may not  be corrected upon review.

## 2023-05-31 DIAGNOSIS — K047 Periapical abscess without sinus: Secondary | ICD-10-CM | POA: Diagnosis not present

## 2023-05-31 DIAGNOSIS — I10 Essential (primary) hypertension: Secondary | ICD-10-CM | POA: Diagnosis not present

## 2023-06-25 DIAGNOSIS — Z419 Encounter for procedure for purposes other than remedying health state, unspecified: Secondary | ICD-10-CM | POA: Diagnosis not present

## 2023-07-08 ENCOUNTER — Other Ambulatory Visit: Payer: Self-pay | Admitting: "Endocrinology

## 2023-07-25 DIAGNOSIS — Z419 Encounter for procedure for purposes other than remedying health state, unspecified: Secondary | ICD-10-CM | POA: Diagnosis not present

## 2023-08-16 ENCOUNTER — Other Ambulatory Visit: Payer: Self-pay | Admitting: "Endocrinology

## 2023-08-25 DIAGNOSIS — Z419 Encounter for procedure for purposes other than remedying health state, unspecified: Secondary | ICD-10-CM | POA: Diagnosis not present

## 2023-09-24 DIAGNOSIS — Z419 Encounter for procedure for purposes other than remedying health state, unspecified: Secondary | ICD-10-CM | POA: Diagnosis not present

## 2023-10-25 DIAGNOSIS — Z419 Encounter for procedure for purposes other than remedying health state, unspecified: Secondary | ICD-10-CM | POA: Diagnosis not present

## 2023-11-18 ENCOUNTER — Other Ambulatory Visit: Payer: Self-pay | Admitting: "Endocrinology

## 2023-11-18 DIAGNOSIS — E890 Postprocedural hypothyroidism: Secondary | ICD-10-CM | POA: Diagnosis not present

## 2023-11-19 LAB — TSH: TSH: 0.66 m[IU]/L

## 2023-11-19 LAB — T4, FREE: Free T4: 1.2 ng/dL (ref 0.8–1.8)

## 2023-11-23 ENCOUNTER — Encounter: Payer: Self-pay | Admitting: "Endocrinology

## 2023-11-23 ENCOUNTER — Ambulatory Visit (INDEPENDENT_AMBULATORY_CARE_PROVIDER_SITE_OTHER): Admitting: "Endocrinology

## 2023-11-23 VITALS — BP 136/98 | HR 80 | Ht 69.0 in | Wt 254.8 lb

## 2023-11-23 DIAGNOSIS — E66812 Obesity, class 2: Secondary | ICD-10-CM

## 2023-11-23 DIAGNOSIS — E890 Postprocedural hypothyroidism: Secondary | ICD-10-CM

## 2023-11-23 DIAGNOSIS — I10 Essential (primary) hypertension: Secondary | ICD-10-CM

## 2023-11-23 DIAGNOSIS — Z6838 Body mass index (BMI) 38.0-38.9, adult: Secondary | ICD-10-CM

## 2023-11-23 MED ORDER — METOPROLOL SUCCINATE ER 25 MG PO TB24: 25.0000 mg | ORAL_TABLET | Freq: Every day | ORAL | 1 refills | Status: AC

## 2023-11-23 NOTE — Patient Instructions (Addendum)
                                       Advice for Weight Management  -For most of us  the best way to lose weight is by diet management. Generally speaking, diet management means consuming less calories intentionally which over time brings about progressive weight loss.  This can be achieved more effectively by avoiding ultra processed carbohydrates, processed meats, unhealthy fats.    It is critically important to know your numbers: how much calorie you are consuming and how much calorie you need. More importantly, our carbohydrates sources should be unprocessed naturally occurring  complex starch food items.  It is always important to balance nutrition also by  appropriate intake of proteins (mainly plant-based), healthy fats/oils, plenty of fruits and vegetables.   -The American College of Lifestyle Medicine (ACL M) recommends nutrition derived mostly from Whole Food, Plant Predominant Sources example an apple instead of applesauce or apple pie. Eat Plenty of vegetables, Mushrooms, fruits, Legumes, Whole Grains, Nuts, seeds in lieu of processed meats, processed snacks/pastries red meat, poultry, eggs.  Use only water or unsweetened tea for hydration.  The College also recommends the need to stay away from risky substances including alcohol, smoking; obtaining 7-9 hours of restorative sleep, at least 150 minutes of moderate intensity exercise weekly, importance of healthy social connections, and being mindful of stress and seek help when it is overwhelming.    -Sticking to a routine mealtime to eat 3 meals a day and avoiding unnecessary snacks is shown to have a big role in weight control. Under normal circumstances, the only time we burn stored energy is when we are hungry, so allow  some hunger to take place- hunger means no food between appropriate meal times, only water.  It is not advisable to starve.   -It is better to avoid simple carbohydrates including:  Cakes, Sweet Desserts, Ice Cream, Soda (diet and regular), Sweet Tea, Candies, Chips, Cookies, Store Bought Juices, Alcohol in Excess of  1-2 drinks a day, Lemonade,  Artificial Sweeteners, Doughnuts, Coffee Creamers, Sugar-free Products, etc, etc.  This is not a complete list...SABRA.    -Consulting with certified diabetes educators is proven to provide you with the most accurate and current information on diet.  Also, you may be  interested in discussing diet options/exchanges , we can schedule a visit with Penny Crumpton, RDN, CDE for individualized nutrition education.  -Exercise: If you are able: 30 -60 minutes a day ,4 days a week, or 150 minutes of moderate intensity exercise weekly.    The longer the better if tolerated.  Combine stretch, strength, and aerobic activities.  If you were told in the past that you have high risk for cardiovascular diseases, or if you are currently symptomatic, you may seek evaluation by your heart doctor prior to initiating moderate to intense exercise programs.

## 2023-11-23 NOTE — Progress Notes (Signed)
 11/23/2023, 9:46 AM               Endocrinology follow-up note   Subjective:    Patient ID: Cheyenne Campbell, female    DOB: 1970-09-01, PCP Cheyenne Campbell ONEIDA, MD   Past Medical History:  Diagnosis Date   Thyroid  disease    History reviewed. No pertinent surgical history. Social History   Socioeconomic History   Marital status: Single    Spouse name: Not on file   Number of children: Not on file   Years of education: Not on file   Highest education level: Not on file  Occupational History   Not on file  Tobacco Use   Smoking status: Never   Smokeless tobacco: Never  Vaping Use   Vaping status: Never Used  Substance and Sexual Activity   Alcohol use: No   Drug use: No   Sexual activity: Not on file  Other Topics Concern   Not on file  Social History Narrative   Not on file   Social Drivers of Health   Financial Resource Strain: Not on file  Food Insecurity: Not on file  Transportation Needs: Not on file  Physical Activity: Not on file  Stress: Not on file  Social Connections: Not on file   Outpatient Encounter Medications as of 11/23/2023  Medication Sig   amLODipine  (NORVASC ) 10 MG tablet TAKE 1 TABLET BY MOUTH EVERY DAY   Cholecalciferol (VITAMIN D-3 PO) Take 1 tablet by mouth daily. (Patient not taking: Reported on 10/06/2022)   levothyroxine  (SYNTHROID ) 50 MCG tablet TAKE 1 TABLET BY MOUTH EVERY DAY BEFORE BREAKFAST   metoprolol  succinate (TOPROL -XL) 25 MG 24 hr tablet Take 1 tablet (25 mg total) by mouth daily.   olopatadine  (PATANOL) 0.1 % ophthalmic solution Place 1 drop into both eyes 2 (two) times daily.   [DISCONTINUED] metoprolol  succinate (TOPROL -XL) 25 MG 24 hr tablet Take 1 tablet (25 mg total) by mouth daily. (Patient not taking: Reported on 11/02/2022)   No facility-administered encounter medications on file as of 11/23/2023.   ALLERGIES: Allergies  Allergen Reactions   Ace Inhibitors  Swelling    VACCINATION STATUS: Immunization History  Administered Date(s) Administered   Influenza Split 11/01/2012   Influenza,inj,Quad PF,6+ Mos 12/25/2020   PFIZER Comirnaty(Gray Top)Covid-19 Tri-Sucrose Vaccine 05/03/2020   PFIZER(Purple Top)SARS-COV-2 Vaccination 10/10/2019, 10/31/2019   Tdap 09/29/2017    HPI Cheyenne Campbell is 53 y.o. female who is being seen in follow-up for  RAI induced hypothyroidism.   She was given I-131 thyroid  ablation on Jul 14, 2018. She was started on Levothyroxine  , though she did not tolerate higher doses.  She is currently on levothyroxine  50 mcg p.o. daily before breakfast.  Her previsit thyroid  function tests are consistent with appropriate replacement.  She presents with weight gain, was found to have uncontrolled blood pressure.      She has no concerning symptoms at this time.  She denies palpitations, tremors, heat intolerance.    -She has history of goiter. She has unidentified thyroid  dysfunction in 1 of her sisters. She also has hypertension, has not been taking blood pressure medications for the last several months. She is attempting to change her lifestyle lately,  presents with fluctuating body weight.  Her lipids are controlled. Review of Systems  Limited as above.  Objective:    BP (!) 136/98   Pulse 80   Ht 5' 9 (1.753 m)   Wt 254 lb 12.8 oz (115.6 kg)   BMI 37.63 kg/m   Wt Readings from Last 3 Encounters:  11/23/23 254 lb 12.8 oz (115.6 kg)  05/23/23 249 lb 12.8 oz (113.3 kg)  11/02/22 241 lb (109.3 kg)    Physical Exam   CMP ( most recent) CMP     Component Value Date/Time   NA 141 10/27/2022 0842   K 4.0 10/27/2022 0842   CL 103 10/27/2022 0842   CO2 29 10/27/2022 0842   GLUCOSE 80 10/27/2022 0842   BUN 6 (L) 10/27/2022 0842   CREATININE 0.66 10/27/2022 0842   CALCIUM 9.2 10/27/2022 0842   PROT 7.8 10/27/2022 0842   AST 18 10/27/2022 0842   ALT 27 10/27/2022 0842   BILITOT 0.4 10/27/2022 0842   GFRNONAA  90 09/29/2017 0841   GFRAA 104 09/29/2017 0841     April 25, 2018 thyroid  uptake and scan: 57.7% uniform uptake in 24 hours consistent with Graves' disease.  Recent Results (from the past 2160 hours)  TSH     Status: None   Collection Time: 11/18/23  1:13 PM  Result Value Ref Range   TSH 0.66 mIU/L    Comment:           Reference Range .           > or = 20 Years  0.40-4.50 .                Pregnancy Ranges           First trimester    0.26-2.66           Second trimester   0.55-2.73           Third trimester    0.43-2.91   T4, free     Status: None   Collection Time: 11/18/23  1:13 PM  Result Value Ref Range   Free T4 1.2 0.8 - 1.8 ng/dL     Assessment & Plan:   1.  RAI induced hypothyroidism  2.  Graves' disease - resolved 3.  Hypertension  She is status post I-131 thyroid  ablation for Graves' disease on Jul 14, 2018. Her most recent thyroid  function tests are consistent with appropriate replacement.  She is advised to continue levothyroxine  50 mcg p.o. daily before breakfast.    - We discussed about the correct intake of her thyroid  hormone, on empty stomach at fasting, with water, separated by at least 30 minutes from breakfast and other medications,  and separated by more than 4 hours from calcium, iron, multivitamins, acid reflux medications (PPIs). -Patient is made aware of the fact that thyroid  hormone replacement is needed for life, dose to be adjusted by periodic monitoring of thyroid  function tests.   Hypertension: Uncontrolled.  She is advised to continue amlodipine  10 mg p.o. daily at breakfast, discussed and added atenolol 25 mg p.o. daily at breakfast as well.    She has controlled lipid panel, terms of her weight concern, she is advised to reengage with lifestyle medicine nutrition. - she acknowledges that there is a room for improvement in her food and drink choices. - Suggestion is made for her to avoid simple carbohydrates  from her diet including  Cakes, Sweet Desserts, Ice Cream, Soda (diet and regular),  Sweet Tea, Candies, Chips, Cookies, Store Bought Juices, Alcohol , Artificial Sweeteners,  Coffee Creamer, and Sugar-free Products, Lemonade. This will help patient to have more stable blood glucose profile and potentially avoid unintended weight gain.  The following Lifestyle Medicine recommendations according to American College of Lifestyle Medicine  [facility]) were discussed and and offered to patient and she  agrees to start the journey:  A. Whole Foods, Plant-Based Nutrition comprising of fruits and vegetables, plant-based proteins, whole-grain carbohydrates was discussed in detail with the patient.   A list for source of those nutrients were also provided to the patient.  Patient will use only water or unsweetened tea for hydration. B.  The need to stay away from risky substances including alcohol, smoking; obtaining 7 to 9 hours of restorative sleep, at least 150 minutes of moderate intensity exercise weekly, the importance of healthy social connections,  and stress management techniques were discussed. C.  A full color page of  Calorie density of various food groups per pound showing examples of each food groups was provided to the patient.   She does not have adequate coverage for GLP-1 receptor agonist for weight control.   She is advised to maintain close follow-up with her PMD.   I spent  25  minutes in the care of the patient today including review of labs from Thyroid  Function, CMP, and other relevant labs ; imaging/biopsy records (current and previous including abstractions from other facilities); face-to-face time discussing  her lab results and symptoms, medications doses, her options of short and long term treatment based on the latest standards of care / guidelines;   and documenting the encounter.  Cheyenne DASEN Jahr  participated in the discussions, expressed understanding, and voiced agreement with the above plans.  All  questions were answered to her satisfaction. she is encouraged to contact clinic should she have any questions or concerns prior to her return visit.   Follow up plan: Return in about 6 months (around 05/22/2024) for F/U with Pre-visit Labs, A1c -NV.   Ranny Earl, MD [facility] [address] [place], [place] [address] Phone: [phone]  Fax: [phone]     11/23/2023, 9:46 AM  This note was partially dictated with voice recognition software. Similar sounding words can be transcribed inadequately or may not  be corrected upon review.

## 2023-11-25 DIAGNOSIS — Z419 Encounter for procedure for purposes other than remedying health state, unspecified: Secondary | ICD-10-CM | POA: Diagnosis not present

## 2023-12-01 ENCOUNTER — Encounter: Payer: Self-pay | Admitting: Family Medicine

## 2023-12-01 ENCOUNTER — Other Ambulatory Visit: Payer: Self-pay

## 2023-12-01 DIAGNOSIS — Z1211 Encounter for screening for malignant neoplasm of colon: Secondary | ICD-10-CM

## 2023-12-25 DIAGNOSIS — Z419 Encounter for procedure for purposes other than remedying health state, unspecified: Secondary | ICD-10-CM | POA: Diagnosis not present

## 2024-01-09 DIAGNOSIS — Z1211 Encounter for screening for malignant neoplasm of colon: Secondary | ICD-10-CM | POA: Diagnosis not present

## 2024-01-11 ENCOUNTER — Other Ambulatory Visit: Payer: Self-pay | Admitting: "Endocrinology

## 2024-01-14 LAB — COLOGUARD: COLOGUARD: NEGATIVE

## 2024-01-16 ENCOUNTER — Ambulatory Visit: Payer: Self-pay | Admitting: Family Medicine

## 2024-02-08 ENCOUNTER — Ambulatory Visit: Payer: Self-pay

## 2024-02-08 NOTE — Telephone Encounter (Signed)
 FYI Only or Action Required?: FYI only for provider: Recommended to urgent care. Patient already made an appointment to see Dr. Duanne on 12/9-patient refused to see any other provider.  Patient was last seen in primary care on 10/06/2022 by Kayla Jeoffrey RAMAN, FNP.  Called Nurse Triage reporting Dizziness.  Symptoms began a week ago.  Interventions attempted: Rest, hydration, or home remedies.  Symptoms are: unchanged.  Triage Disposition: See PCP When Office is Open (Within 3 Days)  Patient/caregiver understands and will follow disposition?: Yes  Copied from CRM #[id number]. Topic: Clinical - Red Word Triage >> Feb 08, 2024  9:57 AM Delon DASEN wrote: Red Word that prompted transfer to Nurse Triage: lightheaded when lying down and dizziness when she gets up Reason for Disposition  [1] MILD dizziness (e.g., walking normally) AND [2] has NOT been evaluated by doctor (or NP/PA) for this  (Exception: Dizziness caused by heat exposure, sudden standing, or poor fluid intake.)  Answer Assessment - Initial Assessment Questions Patient reports lightheadedness that is mild in nature. Patient reports she hasn't been eating well and thinks that might be a problem. Does take blood pressure medication as well.   1. DESCRIPTION: Describe your dizziness.     Patient reports feeling lightheaded with some walking or changing of positions 2. LIGHTHEADED: Do you feel lightheaded? (e.g., somewhat faint, woozy, weak upon standing)     yes 3. VERTIGO: Do you feel like either you or the room is spinning or tilting? (i.e., vertigo)     no 4. SEVERITY: How bad is it?  Do you feel like you are going to faint? Can you stand and walk?     Mild 5. ONSET:  When did the dizziness begin?     Started last week while patient was at work. 6. AGGRAVATING FACTORS: Does anything make it worse? (e.g., standing, change in head position)     Changing positions too quickly causing dizziness. 7. HEART RATE: Can  you tell me your heart rate? How many beats in 15 seconds?  (Note: Not all patients can do this.)       no 8. CAUSE: What do you think is causing the dizziness? (e.g., decreased fluids or food, diarrhea, emotional distress, heat exposure, new medicine, sudden standing, vomiting; unknown)     Patient reports she hasn't been eating well.  9. RECURRENT SYMPTOM: Have you had dizziness before? If Yes, ask: When was the last time? What happened that time?     no 10. OTHER SYMPTOMS: Do you have any other symptoms? (e.g., fever, chest pain, vomiting, diarrhea, bleeding)       no  Protocols used: Dizziness - Lightheadedness-A-AH

## 2024-02-17 ENCOUNTER — Encounter: Payer: Self-pay | Admitting: Family Medicine

## 2024-02-17 ENCOUNTER — Ambulatory Visit: Admitting: Family Medicine

## 2024-02-17 VITALS — BP 148/96 | HR 90 | Temp 97.6°F | Ht 69.0 in | Wt 253.0 lb

## 2024-02-17 DIAGNOSIS — R42 Dizziness and giddiness: Secondary | ICD-10-CM

## 2024-02-17 MED ORDER — MECLIZINE HCL 25 MG PO TABS: 25.0000 mg | ORAL_TABLET | Freq: Three times a day (TID) | ORAL | 0 refills | Status: AC | PRN

## 2024-02-17 NOTE — Progress Notes (Signed)
 Subjective:    Patient ID: Cheyenne Campbell, female    DOB: 12/23/1970, 53 y.o.   MRN: [id number]  Over the last 2 weeks, the patient reports episodic dizziness and lightheadedness.  She states that she will be standing at work when suddenly you feel like the room is spinning.  This typically is triggered by her turning her head.  She feels extremely lightheaded and she has to stop and take a break and sit down to catch her balance.  She denies any syncope or near syncope.  She denies any palpitations or shortness of breath.  She denies any otalgia or sinus pain.  Today on exam I am able to reproduce some of the symptoms with Dix-Hallpike maneuver.  Whenever she sits up, she feels unsteady like the room is spinning.  She denies any headaches.  However she is under tremendous amount of stress.  She is working 2 jobs.  [place]  At night she is working at [employer] for [employer].  She is having a difficult time standing and walking due to the dizziness Past Medical History:  Diagnosis Date  . Thyroid  disease   No past surgical history on file. Current Outpatient Medications on File Prior to Visit  Medication Sig Dispense Refill  . amLODipine  (NORVASC ) 10 MG tablet TAKE 1 TABLET BY MOUTH EVERY DAY 90 tablet 0  . levothyroxine  (SYNTHROID ) 50 MCG tablet TAKE 1 TABLET BY MOUTH EVERY DAY BEFORE BREAKFAST 90 tablet 1  . olopatadine  (PATANOL) 0.1 % ophthalmic solution Place 1 drop into both eyes 2 (two) times daily. 5 mL 3  . Cholecalciferol (VITAMIN D-3 PO) Take 1 tablet by mouth daily. (Patient not taking: Reported on 02/17/2024)    . metoprolol  succinate (TOPROL -XL) 25 MG 24 hr tablet Take 1 tablet (25 mg total) by mouth daily. (Patient not taking: Reported on 02/17/2024) 90 tablet 1   No current facility-administered medications on file prior to visit.   Allergies  Allergen Reactions  . Ace Inhibitors Swelling   Social History   Socioeconomic History  . Marital status:  Single    Spouse name: Not on file  . Number of children: Not on file  . Years of education: Not on file  . Highest education level: Not on file  Occupational History  . Not on file  Tobacco Use  . Smoking status: Never  . Smokeless tobacco: Never  Vaping Use  . Vaping status: Never Used  Substance and Sexual Activity  . Alcohol use: No  . Drug use: No  . Sexual activity: Not on file  Other Topics Concern  . Not on file  Social History Narrative  . Not on file   Social Drivers of Health   Financial Resource Strain: Not on file  Food Insecurity: Not on file  Transportation Needs: Not on file  Physical Activity: Not on file  Stress: Not on file  Social Connections: Not on file  Intimate Partner Violence: Not on file    Review of Systems  Neurological:  Positive for dizziness.  All other systems reviewed and are negative.      Objective:   Physical Exam Constitutional:      General: She is not in acute distress.    Appearance: Normal appearance. She is obese. She is not ill-appearing or toxic-appearing.  HENT:     Right Ear: Tympanic membrane and ear canal normal.     Left Ear: Tympanic membrane and ear canal normal.  Eyes:     Extraocular Movements: Extraocular movements intact.  Cardiovascular:     Rate and Rhythm: Normal rate and regular rhythm.     Heart sounds: Normal heart sounds. No murmur heard.    No friction rub. No gallop.  Pulmonary:     Effort: Pulmonary effort is normal. No respiratory distress.     Breath sounds: Normal breath sounds. No stridor. No wheezing, rhonchi or rales.  Neurological:     General: No focal deficit present.     Mental Status: She is alert and oriented to person, place, and time. Mental status is at baseline.     Cranial Nerves: No cranial nerve deficit.     Sensory: No sensory deficit.     Motor: No weakness.     Coordination: Coordination normal.     Gait: Gait normal.           Assessment & Plan:    Episodic  lightheadedness - Plan: CBC with Differential/Platelet, Comprehensive metabolic panel with GFR Exam today is unremarkable especially neurologic exam.  I see no evidence of a stroke.  I suspect that the patient is having vertigo.  Recommended trying meclizine  25 mg every 8 hours as needed for vertigo.  Also recommended that she rest.  I recommended that she take a week off from work until she is feeling better as the vertigo is likely being exacerbated by her working 2 jobs and constantly being on her feet moving around.  I believe she is able to return to work after 1 week assuming the symptoms are better.  I will check a CBC to evaluate for any anemia.  If symptoms persist, consider an MRI of the brain

## 2024-02-18 LAB — COMPREHENSIVE METABOLIC PANEL WITH GFR
AG Ratio: 1 (calc) (ref 1.0–2.5)
ALT: 22 U/L (ref 6–29)
AST: 17 U/L (ref 10–35)
Albumin: 4.1 g/dL (ref 3.6–5.1)
Alkaline phosphatase (APISO): 89 U/L (ref 37–153)
BUN: 9 mg/dL (ref 7–25)
CO2: 29 mmol/L (ref 20–32)
Calcium: 9.4 mg/dL (ref 8.6–10.4)
Chloride: 103 mmol/L (ref 98–110)
Creat: 0.64 mg/dL (ref 0.50–1.03)
Globulin: 4.1 g/dL — ABNORMAL HIGH (ref 1.9–3.7)
Glucose, Bld: 77 mg/dL (ref 65–99)
Potassium: 4.1 mmol/L (ref 3.5–5.3)
Sodium: 142 mmol/L (ref 135–146)
Total Bilirubin: 0.6 mg/dL (ref 0.2–1.2)
Total Protein: 8.2 g/dL — ABNORMAL HIGH (ref 6.1–8.1)
eGFR: 105 mL/min/1.73m2 (ref >=60)

## 2024-02-18 LAB — CBC WITH DIFFERENTIAL/PLATELET
Absolute Lymphocytes: 1622 {cells}/uL (ref 850–3900)
Absolute Monocytes: 400 {cells}/uL (ref 200–950)
Basophils Absolute: 19 {cells}/uL (ref 0–200)
Basophils Relative: 0.4 %
Eosinophils Absolute: 0 {cells}/uL — ABNORMAL LOW (ref 15–500)
Eosinophils Relative: 0 %
HCT: 35.2 % — ABNORMAL LOW (ref 35.9–46.0)
Hemoglobin: 11.3 g/dL — ABNORMAL LOW (ref 11.7–15.5)
MCH: 26.2 pg — ABNORMAL LOW (ref 27.0–33.0)
MCHC: 32.1 g/dL (ref 31.6–35.4)
MCV: 81.5 fL (ref 81.4–101.7)
MPV: 10.2 fL (ref 7.5–12.5)
Monocytes Relative: 8.5 %
Neutro Abs: 2660 {cells}/uL (ref 1500–7800)
Neutrophils Relative %: 56.6 %
Platelets: 330 Thousand/uL (ref 140–400)
RBC: 4.32 Million/uL (ref 3.80–5.10)
RDW: 12.8 % (ref 11.0–15.0)
Total Lymphocyte: 34.5 %
WBC: 4.7 Thousand/uL (ref 3.8–10.8)

## 2024-02-20 ENCOUNTER — Ambulatory Visit: Payer: Self-pay | Admitting: Family Medicine

## 2024-02-21 ENCOUNTER — Ambulatory Visit: Admitting: Family Medicine

## 2024-05-22 ENCOUNTER — Ambulatory Visit: Admitting: "Endocrinology
